# Patient Record
Sex: Female | Born: 1945 | Race: White | Hispanic: No | Smoking: Former smoker
Health system: Southern US, Community
[De-identification: ages and names within clinical notes are randomized; demographics above are authoritative.]

## PROBLEM LIST (undated history)

## (undated) DIAGNOSIS — Z87891 Personal history of nicotine dependence: Secondary | ICD-10-CM

## (undated) DIAGNOSIS — F32A Depression, unspecified: Secondary | ICD-10-CM

## (undated) DIAGNOSIS — F329 Major depressive disorder, single episode, unspecified: Secondary | ICD-10-CM

## (undated) DIAGNOSIS — E785 Hyperlipidemia, unspecified: Secondary | ICD-10-CM

## (undated) DIAGNOSIS — S42301A Unspecified fracture of shaft of humerus, right arm, initial encounter for closed fracture: Secondary | ICD-10-CM

## (undated) DIAGNOSIS — IMO0002 Reserved for concepts with insufficient information to code with codable children: Secondary | ICD-10-CM

## (undated) DIAGNOSIS — I700 Atherosclerosis of aorta: Secondary | ICD-10-CM

## (undated) DIAGNOSIS — Z905 Acquired absence of kidney: Secondary | ICD-10-CM

## (undated) DIAGNOSIS — Z524 Kidney donor: Secondary | ICD-10-CM

## (undated) DIAGNOSIS — A64 Unspecified sexually transmitted disease: Secondary | ICD-10-CM

## (undated) DIAGNOSIS — M1990 Unspecified osteoarthritis, unspecified site: Secondary | ICD-10-CM

## (undated) DIAGNOSIS — N3281 Overactive bladder: Secondary | ICD-10-CM

## (undated) DIAGNOSIS — Z131 Encounter for screening for diabetes mellitus: Secondary | ICD-10-CM

## (undated) DIAGNOSIS — M419 Scoliosis, unspecified: Secondary | ICD-10-CM

## (undated) DIAGNOSIS — S42302A Unspecified fracture of shaft of humerus, left arm, initial encounter for closed fracture: Secondary | ICD-10-CM

## (undated) DIAGNOSIS — E559 Vitamin D deficiency, unspecified: Secondary | ICD-10-CM

## (undated) HISTORY — DX: Unspecified sexually transmitted disease: A64

## (undated) HISTORY — DX: Reserved for concepts with insufficient information to code with codable children: IMO0002

## (undated) HISTORY — PX: ABDOMINAL HYSTERECTOMY: SHX81

## (undated) HISTORY — DX: Personal history of nicotine dependence: Z87.891

## (undated) HISTORY — DX: Overactive bladder: N32.81

## (undated) HISTORY — DX: Atherosclerosis of aorta: I70.0

## (undated) HISTORY — DX: Encounter for screening for diabetes mellitus: Z13.1

## (undated) HISTORY — DX: Depression, unspecified: F32.A

## (undated) HISTORY — DX: Kidney donor: Z52.4

## (undated) HISTORY — DX: Unspecified osteoarthritis, unspecified site: M19.90

## (undated) HISTORY — DX: Scoliosis, unspecified: M41.9

## (undated) HISTORY — DX: Unspecified fracture of shaft of humerus, right arm, initial encounter for closed fracture: S42.301A

## (undated) HISTORY — DX: Hyperlipidemia, unspecified: E78.5

## (undated) HISTORY — DX: Acquired absence of kidney: Z90.5

## (undated) HISTORY — PX: TONSILLECTOMY: SUR1361

## (undated) HISTORY — DX: Vitamin D deficiency, unspecified: E55.9

## (undated) HISTORY — DX: Unspecified fracture of shaft of humerus, left arm, initial encounter for closed fracture: S42.302A

---
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
## 1999-06-27 ENCOUNTER — Other Ambulatory Visit: Admission: RE | Admit: 1999-06-27 | Discharge: 1999-06-27 | Payer: Self-pay | Admitting: Cardiology

## 2000-06-08 ENCOUNTER — Encounter: Admission: RE | Admit: 2000-06-08 | Discharge: 2000-06-08 | Payer: Self-pay | Admitting: Internal Medicine

## 2000-06-08 ENCOUNTER — Encounter: Payer: Self-pay | Admitting: Internal Medicine

## 2001-04-16 ENCOUNTER — Other Ambulatory Visit: Admission: RE | Admit: 2001-04-16 | Discharge: 2001-04-16 | Payer: Self-pay | Admitting: Obstetrics and Gynecology

## 2001-04-28 DIAGNOSIS — Z524 Kidney donor: Secondary | ICD-10-CM

## 2001-04-28 HISTORY — DX: Kidney donor: Z52.4

## 2001-07-08 ENCOUNTER — Encounter: Admission: RE | Admit: 2001-07-08 | Discharge: 2001-07-08 | Payer: Self-pay | Admitting: Obstetrics and Gynecology

## 2001-07-08 ENCOUNTER — Encounter: Payer: Self-pay | Admitting: Obstetrics and Gynecology

## 2002-08-26 ENCOUNTER — Encounter: Payer: Self-pay | Admitting: Obstetrics and Gynecology

## 2002-08-26 ENCOUNTER — Encounter: Admission: RE | Admit: 2002-08-26 | Discharge: 2002-08-26 | Payer: Self-pay | Admitting: Obstetrics and Gynecology

## 2003-04-21 ENCOUNTER — Other Ambulatory Visit: Admission: RE | Admit: 2003-04-21 | Discharge: 2003-04-21 | Payer: Self-pay | Admitting: Obstetrics and Gynecology

## 2003-08-31 ENCOUNTER — Encounter: Admission: RE | Admit: 2003-08-31 | Discharge: 2003-08-31 | Payer: Self-pay | Admitting: Obstetrics and Gynecology

## 2004-06-20 ENCOUNTER — Other Ambulatory Visit: Admission: RE | Admit: 2004-06-20 | Discharge: 2004-06-20 | Payer: Self-pay | Admitting: Obstetrics and Gynecology

## 2004-08-28 DIAGNOSIS — S42301A Unspecified fracture of shaft of humerus, right arm, initial encounter for closed fracture: Secondary | ICD-10-CM

## 2004-08-28 HISTORY — DX: Unspecified fracture of shaft of humerus, right arm, initial encounter for closed fracture: S42.301A

## 2004-11-21 ENCOUNTER — Encounter: Admission: RE | Admit: 2004-11-21 | Discharge: 2004-11-21 | Payer: Self-pay | Admitting: Obstetrics and Gynecology

## 2004-11-25 ENCOUNTER — Encounter: Admission: RE | Admit: 2004-11-25 | Discharge: 2004-11-25 | Payer: Self-pay | Admitting: Obstetrics and Gynecology

## 2005-02-01 ENCOUNTER — Emergency Department (HOSPITAL_COMMUNITY): Admission: EM | Admit: 2005-02-01 | Discharge: 2005-02-02 | Payer: Self-pay | Admitting: Emergency Medicine

## 2005-06-28 ENCOUNTER — Other Ambulatory Visit: Admission: RE | Admit: 2005-06-28 | Discharge: 2005-06-28 | Payer: Self-pay | Admitting: Obstetrics and Gynecology

## 2005-07-28 HISTORY — PX: BUNIONECTOMY WITH HAMMERTOE RECONSTRUCTION: SHX5600

## 2005-12-05 ENCOUNTER — Encounter: Admission: RE | Admit: 2005-12-05 | Discharge: 2005-12-05 | Payer: Self-pay | Admitting: Obstetrics and Gynecology

## 2006-07-24 ENCOUNTER — Other Ambulatory Visit: Admission: RE | Admit: 2006-07-24 | Discharge: 2006-07-24 | Payer: Self-pay | Admitting: Obstetrics and Gynecology

## 2006-12-27 ENCOUNTER — Encounter: Admission: RE | Admit: 2006-12-27 | Discharge: 2006-12-27 | Payer: Self-pay | Admitting: Obstetrics and Gynecology

## 2007-09-18 ENCOUNTER — Other Ambulatory Visit: Admission: RE | Admit: 2007-09-18 | Discharge: 2007-09-18 | Payer: Self-pay | Admitting: Obstetrics and Gynecology

## 2007-12-30 ENCOUNTER — Encounter: Admission: RE | Admit: 2007-12-30 | Discharge: 2007-12-30 | Payer: Self-pay | Admitting: Obstetrics and Gynecology

## 2008-12-03 ENCOUNTER — Encounter: Admission: RE | Admit: 2008-12-03 | Discharge: 2008-12-24 | Payer: Self-pay | Admitting: Family Medicine

## 2008-12-30 ENCOUNTER — Encounter: Admission: RE | Admit: 2008-12-30 | Discharge: 2008-12-30 | Payer: Self-pay | Admitting: Obstetrics and Gynecology

## 2010-01-06 ENCOUNTER — Encounter: Admission: RE | Admit: 2010-01-06 | Discharge: 2010-01-06 | Payer: Self-pay | Admitting: Obstetrics and Gynecology

## 2010-03-10 ENCOUNTER — Encounter: Admission: RE | Admit: 2010-03-10 | Discharge: 2010-03-10 | Payer: Self-pay | Admitting: Obstetrics and Gynecology

## 2010-12-28 ENCOUNTER — Other Ambulatory Visit: Payer: Self-pay | Admitting: Obstetrics and Gynecology

## 2010-12-28 DIAGNOSIS — Z1231 Encounter for screening mammogram for malignant neoplasm of breast: Secondary | ICD-10-CM

## 2011-01-09 ENCOUNTER — Ambulatory Visit
Admission: RE | Admit: 2011-01-09 | Discharge: 2011-01-09 | Disposition: A | Discharge status: Home / Self Care | Payer: Medicare Other | Source: Ambulatory Visit | Attending: Obstetrics and Gynecology | Admitting: Obstetrics and Gynecology

## 2011-01-09 DIAGNOSIS — Z1231 Encounter for screening mammogram for malignant neoplasm of breast: Secondary | ICD-10-CM

## 2012-01-05 ENCOUNTER — Other Ambulatory Visit: Payer: Self-pay | Admitting: Obstetrics and Gynecology

## 2012-01-05 DIAGNOSIS — Z1231 Encounter for screening mammogram for malignant neoplasm of breast: Secondary | ICD-10-CM

## 2012-01-18 ENCOUNTER — Ambulatory Visit
Admission: RE | Admit: 2012-01-18 | Discharge: 2012-01-18 | Disposition: A | Discharge status: Home / Self Care | Payer: Medicare Other | Source: Ambulatory Visit | Attending: Obstetrics and Gynecology | Admitting: Obstetrics and Gynecology

## 2012-01-18 DIAGNOSIS — Z1231 Encounter for screening mammogram for malignant neoplasm of breast: Secondary | ICD-10-CM

## 2013-01-01 ENCOUNTER — Other Ambulatory Visit: Payer: Self-pay

## 2013-01-01 DIAGNOSIS — Z1231 Encounter for screening mammogram for malignant neoplasm of breast: Secondary | ICD-10-CM

## 2013-01-14 ENCOUNTER — Telehealth: Payer: Self-pay | Admitting: Internal Medicine

## 2013-01-14 NOTE — Telephone Encounter (Signed)
Patient called to say she has transferred her care to [facility].

## 2013-01-26 DIAGNOSIS — M419 Scoliosis, unspecified: Secondary | ICD-10-CM

## 2013-01-26 DIAGNOSIS — IMO0002 Reserved for concepts with insufficient information to code with codable children: Secondary | ICD-10-CM

## 2013-01-26 HISTORY — DX: Reserved for concepts with insufficient information to code with codable children: IMO0002

## 2013-01-26 HISTORY — DX: Scoliosis, unspecified: M41.9

## 2013-02-05 ENCOUNTER — Ambulatory Visit: Payer: Medicare Other

## 2013-02-07 ENCOUNTER — Ambulatory Visit: Payer: Medicare Other

## 2013-02-10 ENCOUNTER — Ambulatory Visit
Admission: RE | Admit: 2013-02-10 | Discharge: 2013-02-10 | Disposition: A | Discharge status: Home / Self Care | Payer: Medicare Other | Source: Ambulatory Visit

## 2013-02-10 DIAGNOSIS — Z1231 Encounter for screening mammogram for malignant neoplasm of breast: Secondary | ICD-10-CM

## 2013-03-18 ENCOUNTER — Encounter: Payer: Self-pay | Admitting: Obstetrics and Gynecology

## 2013-03-19 ENCOUNTER — Encounter: Payer: Self-pay | Admitting: Obstetrics and Gynecology

## 2013-03-19 ENCOUNTER — Ambulatory Visit (INDEPENDENT_AMBULATORY_CARE_PROVIDER_SITE_OTHER): Payer: Medicare Other | Admitting: Obstetrics and Gynecology

## 2013-03-19 VITALS — BP 104/60 | HR 64 | Resp 16 | Ht 62.5 in | Wt 136.0 lb

## 2013-03-19 DIAGNOSIS — Z01419 Encounter for gynecological examination (general) (routine) without abnormal findings: Secondary | ICD-10-CM

## 2013-03-19 MED ORDER — VITAMIN D (ERGOCALCIFEROL) 1.25 MG (50000 UNIT) PO CAPS: 50000.0000 [IU] | ORAL_CAPSULE | ORAL | Status: DC

## 2013-03-19 MED ORDER — TOLTERODINE TARTRATE ER 4 MG PO CP24: 4.0000 mg | ORAL_CAPSULE | Freq: Every day | ORAL | Status: DC

## 2013-03-19 MED ORDER — ESTROGENS CONJUGATED 0.45 MG PO TABS: 0.4500 mg | ORAL_TABLET | Freq: Every day | ORAL | Status: DC

## 2013-03-19 MED ORDER — VALACYCLOVIR HCL 500 MG PO TABS: ORAL_TABLET | ORAL | Status: DC

## 2013-03-19 NOTE — Patient Instructions (Signed)

## 2013-03-19 NOTE — Progress Notes (Signed)
67 y.o.   Divorced    Caucasian   female   G0P0.   here for annual exam.  Rotator cuff and back issues this year.    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  Likes her premarin and her detrol and wants to cont.  No LMP recorded. Patient is postmenopausal.          Sexually active: no  The current method of family planning is none.    Exercising: walking 3-4 days week, back exercises daily Last mammogram: 12/2012 normal  Last pap smear:09/18/07 neg History of abnormal pap: 50 years ago Smoking: quit smoking 20 years ago Alcohol: 6-7 glasses of wine a week Last colonoscopy: 2004 normal, appt 03/24/13 at [facility] Last Bone Density:  02/2010 normal Last tetanus shot: March 06, 2011 Last cholesterol check: not sure  Hgb: pcp               Urine: pcp   Family History  Problem Relation Age of Onset  . Diabetes Mother   . Hypertension Mother   . Heart disease Father     There are no active problems to display for this patient.   Past Medical History  Diagnosis Date  . 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 Broken arms 2006    broke both arms in a fall    Past Surgical History  Procedure Laterality Date  . Bunionectomy with hammertoe reconstruction  07/2005  . Abdominal hysterectomy    . Tonsillectomy      Allergies: Shellfish allergy  Current Outpatient Prescriptions  Medication Sig Dispense Refill  . BIOTIN PO Take by mouth daily.      . Coenzyme Q10-Levocarnitine (CO Q-10 PLUS PO) Take by mouth daily.      Marland Kitchen estrogens, conjugated, (PREMARIN) 0.45 MG tablet Take 0.45 mg by mouth daily. Take daily for 21 days then do not take for 7 days.      . Omega-3 Fatty Acids (OMEGA 3 PO) Take by mouth daily.      Marland Kitchen tolterodine (DETROL LA) 4 MG 24 hr capsule Take 4 mg by mouth daily.      . valACYclovir (VALTREX) 1000 MG tablet Take 1,000 mg by mouth daily. Pt takes 1/2 tab daily      . Vitamin D, Ergocalciferol, (DRISDOL) 50000 UNITS CAPS Take  50,000 Units by mouth every 14 (fourteen) days.       No current facility-administered medications for this visit.    ROS: Pertinent items are noted in HPI.  Social Hx:  Divorced, retired from Agricultural consultant, has a house at [place] she loves going to  Exam:    BP 104/60  Pulse 64  Resp 16  Ht 5' 2.5" (1.588 m)  Wt 136 lb (61.689 kg)  BMI 24.46 kg/m2  Ht and wt stable from last year Wt Readings from Last 3 Encounters:  03/19/13 136 lb (61.689 kg)     Ht Readings from Last 3 Encounters:  03/19/13 5' 2.5" (1.588 m)    General appearance: alert, cooperative and appears stated age Head: Normocephalic, without obvious abnormality, atraumatic Neck: no adenopathy, supple, symmetrical, trachea midline and thyroid not enlarged, symmetric, no tenderness/mass/nodules Lungs: clear to auscultation bilaterally Breasts: Inspection negative, No nipple retraction or dimpling, No nipple discharge or bleeding, No axillary or supraclavicular adenopathy, Normal to palpation without dominant masses Heart: regular rate and rhythm Abdomen: soft, non-tender; bowel sounds normal; no masses,  no organomegaly Extremities: extremities normal, atraumatic, no cyanosis or edema Skin: Skin color, texture, turgor normal. No rashes or lesions Lymph nodes: Cervical, supraclavicular, and axillary nodes normal. No abnormal inguinal nodes palpated Neurologic: Grossly normal   Pelvic: External genitalia:  no lesions              Urethra:  normal appearing urethra with no masses, tenderness or lesions              Bartholins and Skenes: normal                 Vagina: normal appearing vagina with normal color and discharge, no lesions              Cervix: absent              Pap taken: no        Bimanual Exam:  Uterus:  absent                                      Adnexa: normal adnexa in size, nontender and no masses                                      Rectovaginal: Confirms                                       Anus:  normal sphincter tone, no lesions  A: normal menopausal exam, on HRT     TAH 1976 for dysplasia, ovaries retained     HSV right buttock     Kidney donor in 2002     P:     mammogram counseled on breast self exam, mammography screening, adequate intake of calcium and vitamin D, diet and exercise return annually or prn     An After Visit Summary was printed and given to the patient.

## 2013-07-31 ENCOUNTER — Other Ambulatory Visit: Payer: Self-pay | Admitting: Family Medicine

## 2013-07-31 DIAGNOSIS — IMO0002 Reserved for concepts with insufficient information to code with codable children: Secondary | ICD-10-CM

## 2013-07-31 DIAGNOSIS — M79605 Pain in left leg: Secondary | ICD-10-CM

## 2013-08-05 ENCOUNTER — Ambulatory Visit
Admission: RE | Admit: 2013-08-05 | Discharge: 2013-08-05 | Disposition: A | Discharge status: Home / Self Care | Payer: Medicare Other | Source: Ambulatory Visit | Attending: Family Medicine | Admitting: Family Medicine

## 2013-08-05 DIAGNOSIS — IMO0002 Reserved for concepts with insufficient information to code with codable children: Secondary | ICD-10-CM

## 2013-08-05 DIAGNOSIS — M79605 Pain in left leg: Secondary | ICD-10-CM

## 2013-10-24 ENCOUNTER — Telehealth: Payer: Self-pay | Admitting: Obstetrics and Gynecology

## 2013-10-24 NOTE — Telephone Encounter (Signed)
Patient calling, states she has been on Premarin "for ages" and taking daily. Noted today that her rx states one tablet daily as well as take for 21 days and then stop taking for 7 days. Advised that she can take Premarin daily per her records and I would confirm with Dr. Edward JollySilva and call back with any changes in rx.  Patient is agreeable.

## 2013-10-24 NOTE — Telephone Encounter (Signed)
Pt has some questions regarding premarin

## 2013-10-26 ENCOUNTER — Other Ambulatory Visit: Payer: Self-pay | Admitting: Obstetrics and Gynecology

## 2013-10-26 MED ORDER — ESTROGENS CONJUGATED 0.45 MG PO TABS: 0.4500 mg | ORAL_TABLET | Freq: Every day | ORAL | Status: DC

## 2013-10-26 NOTE — Telephone Encounter (Signed)
Her Premarin 0.45 mg should be taken daily. I will make this correction in her prescription in Epic.   Thank you!

## 2013-12-10 ENCOUNTER — Encounter: Payer: Self-pay | Admitting: Obstetrics and Gynecology

## 2014-01-20 ENCOUNTER — Other Ambulatory Visit: Payer: Self-pay

## 2014-01-20 DIAGNOSIS — Z1231 Encounter for screening mammogram for malignant neoplasm of breast: Secondary | ICD-10-CM

## 2014-02-13 ENCOUNTER — Ambulatory Visit
Admission: RE | Admit: 2014-02-13 | Discharge: 2014-02-13 | Disposition: A | Discharge status: Home / Self Care | Payer: 59 | Source: Ambulatory Visit

## 2014-02-13 DIAGNOSIS — Z1231 Encounter for screening mammogram for malignant neoplasm of breast: Secondary | ICD-10-CM

## 2014-03-20 ENCOUNTER — Ambulatory Visit: Payer: Medicare Other | Admitting: Obstetrics and Gynecology

## 2014-03-30 ENCOUNTER — Ambulatory Visit: Payer: Medicare Other | Admitting: Obstetrics and Gynecology

## 2014-04-02 ENCOUNTER — Encounter: Payer: Self-pay | Admitting: Obstetrics and Gynecology

## 2014-04-02 ENCOUNTER — Ambulatory Visit (INDEPENDENT_AMBULATORY_CARE_PROVIDER_SITE_OTHER): Payer: Medicare Other | Admitting: Obstetrics and Gynecology

## 2014-04-02 VITALS — BP 114/60 | HR 70 | Resp 18 | Ht 62.5 in | Wt 136.6 lb

## 2014-04-02 DIAGNOSIS — Z01419 Encounter for gynecological examination (general) (routine) without abnormal findings: Secondary | ICD-10-CM

## 2014-04-02 DIAGNOSIS — E559 Vitamin D deficiency, unspecified: Secondary | ICD-10-CM

## 2014-04-02 MED ORDER — VALACYCLOVIR HCL 500 MG PO TABS: ORAL_TABLET | ORAL | Status: DC

## 2014-04-02 MED ORDER — TOLTERODINE TARTRATE ER 4 MG PO CP24: 4.0000 mg | ORAL_CAPSULE | Freq: Every day | ORAL | Status: DC

## 2014-04-02 MED ORDER — ESTROGENS CONJUGATED 0.3 MG PO TABS: 0.3000 mg | ORAL_TABLET | Freq: Every day | ORAL | Status: DC

## 2014-04-02 NOTE — Patient Instructions (Signed)

## 2014-04-02 NOTE — Progress Notes (Signed)
Patient ID: Kristen Adams, female   DOB: 08-31-1945, 68 y.o.   MRN: [id number] GYNECOLOGY VISIT  PCP:  Sigmund HazelLisa Miller, MD  Referring provider:   HPI: 68 y.o.   Divorced  Caucasian  female   G0P0 with Patient's last menstrual period was 08/28/1974.   here for   AEX.  On Premarin 0.45 mg daily.  Was started on this for hot flashes.  Would like to decrease the dosage.   Wants a pap smear.  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  Has started Yoga.  Loves it!  No vitamin D in 2 weeks.  Was on weekly prior to this.   Hgb:    PCP Urine:  PCP  GYNECOLOGIC HISTORY: Patient's last menstrual period was 08/28/1974. Sexually active:  no   Partner preference: female Contraception:   Hysterectomy Menopausal hormone therapy:  Premarin 0.45mg  DES exposure:  no  Blood transfusions:  no  Sexually transmitted diseases:  HSV of buttocks  GYN procedures and prior surgeries:  TAH Last mammogram: 02-13-14 dense breasts, otherwise normal:[facility]               Last pap and high risk HPV testing:   09-18-07 normal History of abnormal pap smear:  50 years ago had dysplasia of cervix and patient states this is reason for Hysterectomy.   OB History   Grav Para Term Preterm Abortions TAB SAB Ect Mult Living   0                LIFESTYLE: Exercise:  Yoga and walking             Tobacco:   no Alcohol:    6 glasses of wine per week Drug use:  no  OTHER HEALTH MAINTENANCE: Tetanus/TDap:   03-06-11 Gardisil:            n/a Influenza:         05/2013 Zostavax:          Completed with PCP  Bone density:   02/2010 normal:[facility]  Colonoscopy:   05/2013 normal with Dr. Dulce Sellarutlaw at [facility].  Probably will not need another colonoscopy due to her age.  Cholesterol check: 2013 normal with PCP  Family History  Problem Relation Age of Onset  . Diabetes Mother   . Hypertension Mother   . Heart disease Father     There are no active problems to display for this  patient.  Past Medical History  Diagnosis Date  . 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 Broken arms 2006    broke both arms in a fall  . Scoliosis 01/2013  . DDD (degenerative disc disease) 01/2013    lower spine  . Arthritis     Past Surgical History  Procedure Laterality Date  . Bunionectomy with hammertoe reconstruction  07/2005  . Abdominal hysterectomy    . Tonsillectomy      ALLERGIES: Shellfish allergy  Current Outpatient Prescriptions  Medication Sig Dispense Refill  . BIOTIN PO Take by mouth daily.      . Coenzyme Q10-Levocarnitine (CO Q-10 PLUS PO) Take by mouth daily.      Marland Kitchen. estrogens, conjugated, (PREMARIN) 0.45 MG tablet Take 1 tablet (0.45 mg total) by mouth daily.  90 tablet  1  . Omega-3 Fatty Acids (OMEGA 3 PO) Take by mouth daily.      Marland Kitchen. tolterodine (DETROL LA) 4 MG 24  hr capsule Take 1 capsule (4 mg total) by mouth daily.  90 capsule  3  . valACYclovir (VALTREX) 500 MG tablet Take one daily  90 tablet  3  . Vitamin D, Ergocalciferol, (DRISDOL) 50000 UNITS CAPS Take 1 capsule (50,000 Units total) by mouth every 14 (fourteen) days.  30 capsule  0   No current facility-administered medications for this visit.     ROS:  Pertinent items are noted in HPI.  SOCIAL HISTORY:  Retired.  PHYSICAL EXAMINATION:    BP 114/60  Pulse 70  Resp 18  Ht 5' 2.5" (1.588 m)  Wt 136 lb 9.6 oz (61.961 kg)  BMI 24.57 kg/m2  LMP 08/28/1974   Wt Readings from Last 3 Encounters:  04/02/14 136 lb 9.6 oz (61.961 kg)  03/19/13 136 lb (61.689 kg)     Ht Readings from Last 3 Encounters:  04/02/14 5' 2.5" (1.588 m)  03/19/13 5' 2.5" (1.588 m)    General appearance: alert, cooperative and appears stated age Head: Normocephalic, without obvious abnormality, atraumatic Neck: no adenopathy, supple, symmetrical, trachea midline and thyroid not enlarged, symmetric, no tenderness/mass/nodules Lungs: clear to auscultation  bilaterally Breasts: Inspection negative, No nipple retraction or dimpling, No nipple discharge or bleeding, No axillary or supraclavicular adenopathy, Normal to palpation without dominant masses Heart: regular rate and rhythm Abdomen: soft, non-tender; no masses,  no organomegaly Extremities: extremities normal, atraumatic, no cyanosis or edema Skin: Skin color, texture, turgor normal. No rashes or lesions Lymph nodes: Cervical, supraclavicular, and axillary nodes normal. No abnormal inguinal nodes palpated Neurologic: Grossly normal  Pelvic: External genitalia:  no lesions              Urethra:  normal appearing urethra with no masses, tenderness or lesions              Bartholins and Skenes: normal                 Vagina: normal appearing vagina with normal color and discharge, no lesions              Cervix: absent              Pap and high risk: Yes.  .            Bimanual Exam:  Uterus:  absent                                      Adnexa: normal adnexa in size, nontender and no masses                                      Rectovaginal: Confirms                                      Anus:  normal sphincter tone, no lesions  ASSESSMENT  Normal gynecologic exam. Status post hysterectomy for cervical dysplasia. ERT patient.  History of HSV. Vitamin D deficency.   PLAN  Mammogram recommended yearly.  Pap smear performed.  Counseled on self breast exam, Calcium and vitamin D intake, exercise. Check Vit D level. Reduce Premarin to 0.3 mg daily.  Discussed risks of stroke, MI, DVT, PE, ans breast cancer.  Wishes to continue with therapy.  See Epic  orders. Valtrex 500 mg daily.  See Epic orders. Return annually or prn   An After Visit Summary was printed and given to the patient.

## 2014-04-03 LAB — VITAMIN D 25 HYDROXY (VIT D DEFICIENCY, FRACTURES): VIT D 25 HYDROXY: 44 ng/mL (ref 30–89)

## 2014-04-07 LAB — IPS PAP SMEAR ONLY

## 2014-04-24 ENCOUNTER — Encounter: Payer: Self-pay | Admitting: Obstetrics and Gynecology

## 2014-10-14 ENCOUNTER — Telehealth: Payer: Self-pay | Admitting: Obstetrics and Gynecology

## 2014-10-14 NOTE — Telephone Encounter (Signed)
Medication refill request: ? Last AEX:  04/02/14 Dr. Edward JollySilva Next AEX: 04/30/15 Last MMG (if hormonal medication request): 02/16/14 BIRADS1:Neg Refill authorized: Premarin 04/02/14 #90/3R.

## 2014-10-14 NOTE — Telephone Encounter (Signed)
Patient's states her insurance does not cover premarin anymore. She wants to know if she can have an alternative

## 2014-10-14 NOTE — Telephone Encounter (Signed)
I believe that the patient may need a prior authorization for her Premarin.  Please assist.  Cc- Kristen Adams

## 2014-10-15 NOTE — Telephone Encounter (Signed)
I would have the patient do Estradiol 0.5 mg daily.  She may have refills until she is due for her annual exam. Stop Premarin.

## 2014-10-15 NOTE — Telephone Encounter (Signed)
Patient's prior authorization for premarin has been denied. Forms to Dr.Silva's desk for review and advise.

## 2014-10-15 NOTE — Telephone Encounter (Signed)
It looks like Femring is a covered medication if the patient would like to try an estrogen that is covered.  Other options are to pay out of pocket for the Premarin or switch to [facility]stradiol which I [facility]?

## 2014-10-15 NOTE — Telephone Encounter (Signed)
Spoke with [facility]. Initiated prior authorization for Premarin 0.3mg  tablets by phone. Will await results.

## 2014-10-15 NOTE — Telephone Encounter (Signed)
Spoke with patient. Advised patient of message as seen below from Dr.Silva. Patient would like to start on Estradiol at this time. Patient would like rx sent to [facility] off [address] Dr.Silva please advised dose and I will be happy to place order.

## 2014-10-16 MED ORDER — ESTRADIOL 0.5 MG PO TABS: 0.5000 mg | ORAL_TABLET | Freq: Every day | ORAL | Status: DC

## 2014-10-16 NOTE — Telephone Encounter (Signed)
Estradiol 0.5mg  #90 2RF sent to [facility] off [address]. Patient aware Estradiol will replace premarin and needs to stop taking premarin once she picks up estradiol.  Routing to provider for final review. Patient agreeable to disposition. Will close encounter

## 2015-01-21 ENCOUNTER — Other Ambulatory Visit: Payer: Self-pay

## 2015-01-21 DIAGNOSIS — Z1231 Encounter for screening mammogram for malignant neoplasm of breast: Secondary | ICD-10-CM

## 2015-02-18 ENCOUNTER — Ambulatory Visit
Admission: RE | Admit: 2015-02-18 | Discharge: 2015-02-18 | Disposition: A | Discharge status: Home / Self Care | Payer: Medicare Other | Source: Ambulatory Visit

## 2015-02-18 DIAGNOSIS — Z1231 Encounter for screening mammogram for malignant neoplasm of breast: Secondary | ICD-10-CM

## 2015-03-09 ENCOUNTER — Other Ambulatory Visit: Payer: Self-pay | Admitting: Obstetrics and Gynecology

## 2015-03-09 NOTE — Telephone Encounter (Signed)
Medication refill request: Valtrex 500 mg  Last AEX:  04/02/14 with BS Next AEX: 04/30/15 with BS Last MMG (if hormonal medication request): n/a Refill authorized: #90/0 rfs  (Routed to Dr. Hyacinth MeekerMiller since Dr. Edward JollySilva is out of office today)

## 2015-03-25 ENCOUNTER — Other Ambulatory Visit: Payer: Self-pay | Admitting: Obstetrics and Gynecology

## 2015-03-25 NOTE — Telephone Encounter (Signed)
Medication refill request: Detrol  Last AEX:  04/02/14 Dr. Edward Jolly Next AEX: 04/30/15 Dr. Edward Jolly Last MMG (if hormonal medication request): 02/18/15 BIRADS1:Neg Refill authorized: 04/02/14 #90caps /3 R. Today #90/0R?

## 2015-03-29 ENCOUNTER — Other Ambulatory Visit: Payer: Self-pay | Admitting: Obstetrics and Gynecology

## 2015-03-29 NOTE — Telephone Encounter (Signed)
03/26/15 #90/0 rfs sent to [facility]- rx denied.

## 2015-04-30 ENCOUNTER — Encounter: Payer: Self-pay | Admitting: Obstetrics and Gynecology

## 2015-04-30 ENCOUNTER — Ambulatory Visit (INDEPENDENT_AMBULATORY_CARE_PROVIDER_SITE_OTHER): Payer: Medicare Other | Admitting: Obstetrics and Gynecology

## 2015-04-30 ENCOUNTER — Ambulatory Visit: Payer: Medicare Other | Admitting: Obstetrics and Gynecology

## 2015-04-30 VITALS — BP 108/60 | HR 72 | Resp 16 | Ht 62.5 in | Wt 136.0 lb

## 2015-04-30 DIAGNOSIS — Z01419 Encounter for gynecological examination (general) (routine) without abnormal findings: Secondary | ICD-10-CM | POA: Diagnosis not present

## 2015-04-30 DIAGNOSIS — M8580 Other specified disorders of bone density and structure, unspecified site: Secondary | ICD-10-CM

## 2015-04-30 DIAGNOSIS — Z0000 Encounter for general adult medical examination without abnormal findings: Secondary | ICD-10-CM

## 2015-04-30 LAB — POCT URINALYSIS DIPSTICK
BILIRUBIN UA: NEGATIVE
Blood, UA: NEGATIVE
Glucose, UA: NEGATIVE
Ketones, UA: NEGATIVE
LEUKOCYTES UA: NEGATIVE
Nitrite, UA: NEGATIVE
PROTEIN UA: NEGATIVE
Urobilinogen, UA: NEGATIVE
pH, UA: 6

## 2015-04-30 MED ORDER — VALACYCLOVIR HCL 500 MG PO TABS: 500.0000 mg | ORAL_TABLET | Freq: Every day | ORAL | Status: DC

## 2015-04-30 MED ORDER — ESTRADIOL 0.5 MG PO TABS: 0.5000 mg | ORAL_TABLET | Freq: Every day | ORAL | Status: DC

## 2015-04-30 MED ORDER — TOLTERODINE TARTRATE ER 4 MG PO CP24: 4.0000 mg | ORAL_CAPSULE | Freq: Every day | ORAL | Status: DC

## 2015-04-30 NOTE — Progress Notes (Signed)
69 y.o. G0P0 Divorced Caucasian female here for annual exam.    On ERT.  Taking Estrace 0.5 mg daily and wants to continue.   Noting some emotional changes.  Feeling down and on the verge of tears. Thinks this correlated with the switch from Premarin to Estrace.  Change was made per patient request to to cost reasons.  Hx of anxiety and depression in past and used Lexapro with important life events.  No current stressors.  Denies suicidal and homicidal ideation.   Detrol LA working well to control bladder symptoms.   PCP:  Sigmund Hazel   Patient's last menstrual period was 08/28/1974.          Sexually active: No.  The current method of family planning is status post hysterectomy.    Exercising: Yes.    Yoga, walking, gym Smoker:  no  Health Maintenance: Pap:  04/02/14 Neg History of abnormal Pap:  Yes, dysplasia ~ 50 years ago.  MMG:  02/18/15 BIRADS1:Neg Colonoscopy:  05/2013 Dr. Dulce Sellar - Normal  BMD:   02/2010   Result:  Osteopenia - mild. TDaP:  2012 Screening Labs:  Hb today: PCP, Urine today: Negative   reports that she quit smoking about 22 years ago. Her smoking use included Cigarettes. She has never used smokeless tobacco. She reports that she drinks about 3.5 oz of alcohol per week. She reports that she does not use illicit drugs.  Past Medical History  Diagnosis Date  . 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 Broken arms 2006    broke both arms in a fall  . Scoliosis 01/2013  . DDD (degenerative disc disease) 01/2013    lower spine  . Arthritis     Past Surgical History  Procedure Laterality Date  . Bunionectomy with hammertoe reconstruction  07/2005  . Abdominal hysterectomy    . Tonsillectomy      Current Outpatient Prescriptions  Medication Sig Dispense Refill  . BIOTIN PO Take by mouth daily.    . Cholecalciferol (VITAMIN D) 2000 UNITS tablet Take 2,000 Units by mouth daily.    . Coenzyme Q10-Levocarnitine (CO  Q-10 PLUS PO) Take by mouth daily.    Marland Kitchen estradiol (ESTRACE) 0.5 MG tablet Take 1 tablet (0.5 mg total) by mouth daily. 90 tablet 2  . Multiple Minerals-Vitamins (CALCIUM-MAGNESIUM-ZINC-D3 PO) Take by mouth daily.    . Omega-3 Fatty Acids (OMEGA 3 PO) Take by mouth daily.    Marland Kitchen tolterodine (DETROL LA) 4 MG 24 hr capsule TAKE 1 CAPSULE BY MOUTH DAILY. 90 capsule 0  . valACYclovir (VALTREX) 500 MG tablet TAKE 1 TABLET BY MOUTH DAILY 90 tablet 0   No current facility-administered medications for this visit.    Family History  Problem Relation Age of Onset  . Diabetes Mother   . Hypertension Mother   . Heart disease Father     ROS:  Pertinent items are noted in HPI.  Otherwise, a comprehensive ROS was negative.  Exam:   BP 108/60 mmHg  Pulse 72  Resp 16  Ht 5' 2.5" (1.588 m)  Wt 136 lb (61.689 kg)  BMI 24.46 kg/m2  LMP 08/28/1974    General appearance: alert, cooperative and appears stated age Head: Normocephalic, without obvious abnormality, atraumatic Neck: no adenopathy, supple, symmetrical, trachea midline and thyroid normal to inspection and palpation Lungs: clear to auscultation bilaterally Breasts: normal appearance, no masses or tenderness, Inspection negative, No nipple retraction or dimpling,  No nipple discharge or bleeding, No axillary or supraclavicular adenopathy Heart: regular rate and rhythm Abdomen: soft, non-tender; bowel sounds normal; no masses,  no organomegaly Extremities: extremities normal, atraumatic, no cyanosis or edema Skin: Skin color, texture, turgor normal. No rashes or lesions Lymph nodes: Cervical, supraclavicular, and axillary nodes normal. No abnormal inguinal nodes palpated Neurologic: Grossly normal  Pelvic: External genitalia:  no lesions              Urethra:  normal appearing urethra with no masses, tenderness or lesions              Bartholins and Skenes: normal                 Vagina: normal appearing vagina with normal color and  discharge, erythema right apex - Pap taken.              Cervix: absent              Pap taken: Yes.   Bimanual Exam:  Uterus:  uterus absent              Adnexa: no mass, fullness, tenderness              Rectovaginal: Yes.  .  Confirms.              Anus:  normal sphincter tone, no lesions  Chaperone was present for exam.  Assessment:   Well woman visit with normal exam. Status post hysterectomy for cervical dysplasia. ERT patient.  Depression.   History of HSV. Overactive bladder.  On Detrol LA. Vitamin D deficency.   Plan: Yearly mammogram recommended after age 35.  Recommended self breast exam.  Pap and HR HPV as above. Discussed Calcium, Vitamin D, regular exercise program including cardiovascular and weight bearing exercise. Labs performed.  No..   See orders. Refills given on medications.  Yes.  .  See orders.  Estrace 0.5 mg.  Patient will try to take 1/2 tablet.  Discussed risks of DVT, PE, MI, stroke, breast cancer.  Continue Detrol LA 4 mg daily.     Continue Valtrex 500 mg daily. Bone density ordered.  Will do with next mammogram.  Discussed medical therapy for depression, counseling.  Patient is declining both at this time.  She will continue to monitor and call back if she decides to pursue either or both of these.  Follow up annually and prn.      After visit summary provided.

## 2015-04-30 NOTE — Patient Instructions (Signed)

## 2015-05-06 LAB — IPS PAP SMEAR ONLY

## 2015-05-13 ENCOUNTER — Telehealth: Payer: Self-pay | Admitting: Obstetrics and Gynecology

## 2015-05-13 NOTE — Telephone Encounter (Addendum)
Spoke with patient. Advised of results as seen below from Dr.Silva. Patient is agreeable and verbalizes understanding.  Notes Recorded by Alphonsa Overall, CMA on 05/06/2015 at 1:55 PM AEX scheduled 05-17-16. Notes Recorded by Patton Salles, MD on 05/06/2015 at 12:26 PM Pap normal.  Annual exam recall - 02.  Routing to provider for final review. Patient agreeable to disposition. Will close encounter.

## 2015-05-13 NOTE — Telephone Encounter (Signed)
Patient calling to get papsmear results did not receive letter in the mail.

## 2015-05-28 ENCOUNTER — Emergency Department (HOSPITAL_COMMUNITY)
Admission: EM | Admit: 2015-05-28 | Discharge: 2015-05-28 | Disposition: A | Discharge status: Home / Self Care | Payer: Medicare Other | Attending: Emergency Medicine | Admitting: Emergency Medicine

## 2015-05-28 ENCOUNTER — Encounter (HOSPITAL_COMMUNITY): Payer: Self-pay | Admitting: Emergency Medicine

## 2015-05-28 DIAGNOSIS — Y998 Other external cause status: Secondary | ICD-10-CM | POA: Insufficient documentation

## 2015-05-28 DIAGNOSIS — Y280XXA Contact with sharp glass, undetermined intent, initial encounter: Secondary | ICD-10-CM | POA: Insufficient documentation

## 2015-05-28 DIAGNOSIS — Z8739 Personal history of other diseases of the musculoskeletal system and connective tissue: Secondary | ICD-10-CM | POA: Insufficient documentation

## 2015-05-28 DIAGNOSIS — Z79899 Other long term (current) drug therapy: Secondary | ICD-10-CM | POA: Diagnosis not present

## 2015-05-28 DIAGNOSIS — S91119A Laceration without foreign body of unspecified toe without damage to nail, initial encounter: Secondary | ICD-10-CM

## 2015-05-28 DIAGNOSIS — Z524 Kidney donor: Secondary | ICD-10-CM | POA: Insufficient documentation

## 2015-05-28 DIAGNOSIS — S91114A Laceration without foreign body of right lesser toe(s) without damage to nail, initial encounter: Secondary | ICD-10-CM | POA: Diagnosis not present

## 2015-05-28 DIAGNOSIS — Y9389 Activity, other specified: Secondary | ICD-10-CM | POA: Diagnosis not present

## 2015-05-28 DIAGNOSIS — S99921A Unspecified injury of right foot, initial encounter: Secondary | ICD-10-CM | POA: Diagnosis present

## 2015-05-28 DIAGNOSIS — Y9289 Other specified places as the place of occurrence of the external cause: Secondary | ICD-10-CM | POA: Diagnosis not present

## 2015-05-28 MED ORDER — LIDOCAINE HCL (PF) 1 % IJ SOLN
5.0000 mL | Freq: Once | INTRAMUSCULAR | Status: AC
  Administered 2015-05-28: 5 mL via INTRADERMAL
  Filled 2015-05-28: qty 5

## 2015-05-28 NOTE — Discharge Instructions (Signed)
Keep sutures clean and dry. Follow-up with primary care physician in 1 week for suture removal or sooner for any signs of infection.

## 2015-05-28 NOTE — ED Notes (Signed)
Pt was at [employer] buying wine when a bottle fell, broke and cut her right little toe.  Pt has foot wrapped at this time.

## 2015-05-28 NOTE — ED Provider Notes (Signed)
CSN: [id number]     Arrival date & time 05/28/15  1635 History  By signing my name below, I, Tanda Rockers, attest that this documentation has been prepared under the direction and in the presence of Sharilyn Sites, PA-C. Electronically Signed: Tanda Rockers, ED Scribe. 05/28/2015. 4:59 PM.  Chief Complaint  Patient presents with  . Toe Injury   The history is provided by the patient. No language interpreter was used.     HPI Comments: Stepheni Cameron is a 69 y.o. female who presents to the Emergency Department complaining of laceration to right 5th digit that occurred earlier today. Pt states that she was at [facility] when a bottle of wine fell on her foot and broke, slicing her toe. Pt bandaged foot PTA. She notes mild pain around the laceration but is otherwise complaint free. Denies numbness, weakness, tingling, or any other associated symptoms. Tetanus up to date.   Past Medical History  Diagnosis Date  . 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 Broken arms 2006    broke both arms in a fall  . Scoliosis 01/2013  . DDD (degenerative disc disease) 01/2013    lower spine  . Arthritis    Past Surgical History  Procedure Laterality Date  . Bunionectomy with hammertoe reconstruction  07/2005  . Abdominal hysterectomy    . Tonsillectomy     Family History  Problem Relation Age of Onset  . Diabetes Mother   . Hypertension Mother   . Heart disease Father    Social History  Substance Use Topics  . Smoking status: Former Smoker    Types: Cigarettes    Quit date: 03/19/1993  . Smokeless tobacco: Never Used  . Alcohol Use: 3.5 oz/week    7 drink(s) per week     Comment: 6-7 glasses of wine a week   OB History    Gravida Para Term Preterm AB TAB SAB Ectopic Multiple Living   0              Review of Systems  Musculoskeletal: Positive for arthralgias.  Skin: Positive for wound (Laceration to right 5th digit).  Neurological:  Negative for weakness and numbness.  All other systems reviewed and are negative.  Allergies  Shellfish allergy  Home Medications   Prior to Admission medications   Medication Sig Start Date End Date Taking? Authorizing Provider  BIOTIN PO Take by mouth daily.    Historical Provider, MD  Cholecalciferol (VITAMIN D) 2000 UNITS tablet Take 2,000 Units by mouth daily.    Historical Provider, MD  Coenzyme Q10-Levocarnitine (CO Q-10 PLUS PO) Take by mouth daily.    Historical Provider, MD  estradiol (ESTRACE) 0.5 MG tablet Take 1 tablet (0.5 mg total) by mouth daily. 04/30/15   Brook Rosalin Hawking, MD  Multiple Minerals-Vitamins (CALCIUM-MAGNESIUM-ZINC-D3 PO) Take by mouth daily.    Historical Provider, MD  Omega-3 Fatty Acids (OMEGA 3 PO) Take by mouth daily.    Historical Provider, MD  tolterodine (DETROL LA) 4 MG 24 hr capsule Take 1 capsule (4 mg total) by mouth daily. 04/30/15   Brook Rosalin Hawking, MD  valACYclovir (VALTREX) 500 MG tablet Take 1 tablet (500 mg total) by mouth daily. 04/30/15   Patton Salles, MD   Triage Vitals: BP 135/65 mmHg  Pulse 77  Temp(Src) 97.9 F (36.6 C) (Oral)  Resp 18  SpO2 99%  LMP 08/28/1974  Physical Exam  Constitutional: She is oriented to person, place, and time. She appears well-developed and well-nourished.  HENT:  Head: Normocephalic and atraumatic.  Mouth/Throat: Oropharynx is clear and moist.  Eyes: Conjunctivae and EOM are normal. Pupils are equal, round, and reactive to light.  Neck: Normal range of motion.  Cardiovascular: Normal rate, regular rhythm and normal heart sounds.   Pulmonary/Chest: Effort normal and breath sounds normal.  Abdominal: Soft. Bowel sounds are normal.  Musculoskeletal: Normal range of motion.  Superficial 1cm laceration to dorsum of right foot Semi-circular 1cm laceration to right 5th toe with minimal bleeding; full ROM maintained; no deformities noted; foot NVI  Neurological: She is alert and  oriented to person, place, and time.  Skin: Skin is warm and dry.  Psychiatric: She has a normal mood and affect.  Nursing note and vitals reviewed.   ED Course  Procedures (including critical care time)  LACERATION REPAIR Performed by: Garlon Hatchet Authorized by: Garlon Hatchet Consent: Verbal consent obtained. Risks and benefits: risks, benefits and alternatives were discussed Consent given by: patient Patient identity confirmed: provided demographic data Prepped and Draped in normal sterile fashion Wound explored  Laceration Location: right 5ht toe  Laceration Length: 3 cm  No Foreign Bodies seen or palpated  Anesthesia: local infiltration  Local anesthetic: lidocaine 1% without epinephrine  Anesthetic total: 3 ml  Irrigation method: syringe Amount of cleaning: standard  Skin closure: 4-0 prolene  Number of sutures: 3  Technique: simple interrupted  Patient tolerance: Patient tolerated the procedure well with no immediate complications.  LACERATION REPAIR Performed by: Garlon Hatchet Authorized by: Garlon Hatchet Consent: Verbal consent obtained. Risks and benefits: risks, benefits and alternatives were discussed Consent given by: patient Patient identity confirmed: provided demographic data Prepped and Draped in normal sterile fashion Wound explored  Laceration Location: dorsal right foot  Laceration Length: 1cm, superficial  No Foreign Bodies seen or palpated  Anesthesia: none  Local anesthetic: lnone  Anesthetic total: 0 ml  Irrigation method: syringe Amount of cleaning: standard  Skin closure: dermabond  Number of sutures: 0  Technique: n/a  Patient tolerance: Patient tolerated the procedure well with no immediate complications.   DIAGNOSTIC STUDIES: Oxygen Saturation is 99% on RA, normal by my interpretation.    COORDINATION OF CARE: 4:58 PM-Discussed treatment plan which includes suture placement with pt at bedside and pt  agreed to plan.   Labs Review Labs Reviewed - No data to display  Imaging Review No results found.    EKG Interpretation None      MDM   Final diagnoses:  Toe laceration, initial encounter   69 year old female here with laceration of right foot. Bottle of wine fell on her foot at [employer].  Lacerations are uncomplicated.  Offered x-ray to evaluate for fracture of 5ht digit, patient declined.  Tetanus UTD.  Lacerations repaired as above, patient tolerated well.  Discussed home wound care. Patient to follow-up with her PCP in one week for suture removal or sooner for any complications.  Discussed plan with patient, he/she acknowledged understanding and agreed with plan of care.  Return precautions given for new or worsening symptoms.  I personally performed the services described in this documentation, which was scribed in my presence. The recorded information has been reviewed and is accurate.     Garlon Hatchet, PA-C 05/28/15 1739  Richardean Canal, MD 05/28/15 [phone]

## 2015-05-28 NOTE — ED Notes (Signed)
Pt comfortable in room, lidocaine in room for provider

## 2016-01-10 ENCOUNTER — Other Ambulatory Visit: Payer: Self-pay

## 2016-01-10 DIAGNOSIS — Z1231 Encounter for screening mammogram for malignant neoplasm of breast: Secondary | ICD-10-CM

## 2016-02-22 ENCOUNTER — Ambulatory Visit: Payer: Medicare Other

## 2016-03-09 ENCOUNTER — Ambulatory Visit
Admission: RE | Admit: 2016-03-09 | Discharge: 2016-03-09 | Disposition: A | Discharge status: Home / Self Care | Payer: Medicare Other | Source: Ambulatory Visit

## 2016-03-09 DIAGNOSIS — Z1231 Encounter for screening mammogram for malignant neoplasm of breast: Secondary | ICD-10-CM

## 2016-05-10 ENCOUNTER — Ambulatory Visit: Payer: Medicare Other | Admitting: Obstetrics and Gynecology

## 2016-05-17 ENCOUNTER — Other Ambulatory Visit: Payer: Self-pay | Admitting: Family Medicine

## 2016-05-17 ENCOUNTER — Ambulatory Visit: Payer: Medicare Other | Admitting: Obstetrics and Gynecology

## 2016-05-17 DIAGNOSIS — M8580 Other specified disorders of bone density and structure, unspecified site: Secondary | ICD-10-CM

## 2016-05-26 ENCOUNTER — Ambulatory Visit
Admission: RE | Admit: 2016-05-26 | Discharge: 2016-05-26 | Disposition: A | Discharge status: Home / Self Care | Payer: Medicare Other | Source: Ambulatory Visit | Attending: Family Medicine | Admitting: Family Medicine

## 2016-05-26 DIAGNOSIS — M8580 Other specified disorders of bone density and structure, unspecified site: Secondary | ICD-10-CM

## 2016-06-05 ENCOUNTER — Ambulatory Visit (INDEPENDENT_AMBULATORY_CARE_PROVIDER_SITE_OTHER): Payer: Medicare Other | Admitting: Obstetrics and Gynecology

## 2016-06-05 ENCOUNTER — Encounter: Payer: Self-pay | Admitting: Obstetrics and Gynecology

## 2016-06-05 VITALS — BP 116/68 | HR 68 | Resp 16 | Ht 62.5 in | Wt 135.0 lb

## 2016-06-05 DIAGNOSIS — Z01419 Encounter for gynecological examination (general) (routine) without abnormal findings: Secondary | ICD-10-CM

## 2016-06-05 MED ORDER — TOLTERODINE TARTRATE ER 4 MG PO CP24: 4.0000 mg | ORAL_CAPSULE | Freq: Every day | ORAL | 3 refills | Status: DC

## 2016-06-05 MED ORDER — VALACYCLOVIR HCL 500 MG PO TABS: 500.0000 mg | ORAL_TABLET | Freq: Every day | ORAL | 3 refills | Status: DC

## 2016-06-05 NOTE — Progress Notes (Signed)
70 y.o. G75P0 Divorced Caucasian female here for annual exam.    Off her ERT just after her visit last year.  Happy to be off them.   Needs refill of Detrol LA.  Has been taking for a long time and wants to eventually stop it.  Voiding every 5 - 6 hours. NF once per night.  No dry mouth or constipation.   Also needs Valtrex.  Taking this every other day and doing well.   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.  Went to [place] in August.   PCP:   Kristen Adams  Patient's last menstrual period was 08/28/1974.           Sexually active: No.  The current method of family planning is status post hysterectomy.    Exercising: Yes.    yoga & walking Smoker:  no  Health Maintenance: Pap:  04-30-15 neg History of abnormal Pap:  Yes, dysplasia~55yrs ago MMG:  03-09-16 category c density birads 1:neg Colonoscopy:  2014 normal BMD:   2017 Result:  normal TDaP:  2012 HIV: not done Hep C: neg 2017 Screening Labs:  Hb today: pcp, Urine today: pcp Self breast exam: done monthly   reports that she quit smoking about 23 years ago. Her smoking use included Cigarettes. She has never used smokeless tobacco. She reports that she drinks about 3.0 oz of alcohol per week . She reports that she does not use drugs.  Past Medical History:  Diagnosis Date  . Arthritis   . Broken arms 2006   broke both arms in a fall  . DDD (degenerative disc disease) 01/2013   lower spine  . 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 Scoliosis 01/2013  . STD (sexually transmitted disease)    HSV right buttock    Past Surgical History:  Procedure Laterality Date  . ABDOMINAL HYSTERECTOMY    . BUNIONECTOMY WITH HAMMERTOE RECONSTRUCTION  07/2005  . TONSILLECTOMY      Current Outpatient Prescriptions  Medication Sig Dispense Refill  . BIOTIN PO Take by mouth daily.    . Cholecalciferol (VITAMIN D) 2000 UNITS tablet Take 2,000 Units by mouth daily.    . Coenzyme Q10-Levocarnitine (CO Q-10 PLUS PO) Take by mouth daily.    . Multiple  Minerals-Vitamins (CALCIUM-MAGNESIUM-ZINC-D3 PO) Take by mouth daily.    . Nutritional Supplements (ESTROVEN PO) Take by mouth daily.    . Omega-3 Fatty Acids (OMEGA 3 PO) Take by mouth daily.    Marland Kitchen tolterodine (DETROL LA) 4 MG 24 hr capsule Take 1 capsule (4 mg total) by mouth daily. 90 capsule 3  . valACYclovir (VALTREX) 500 MG tablet Take 1 tablet (500 mg total) by mouth daily. 90 tablet 3   No current facility-administered medications for this visit.     Family History  Problem Relation Age of Onset  . Diabetes Mother   . Hypertension Mother   . Heart disease Father     ROS:  Pertinent items are noted in HPI.  Otherwise, a comprehensive ROS was negative.  Exam:   BP 116/68   Pulse 68   Resp 16   Ht 5' 2.5" (1.588 m)   Wt 135 lb (61.2 kg)   LMP 08/28/1974   BMI 24.30 kg/m     General appearance: alert, cooperative and appears stated age Head: Normocephalic, without obvious abnormality, atraumatic Neck: no adenopathy, supple, symmetrical, trachea midline and thyroid normal to inspection and palpation Lungs: clear to auscultation bilaterally Breasts: normal appearance, no masses or tenderness, No  nipple retraction or dimpling, No nipple discharge or bleeding, No axillary or supraclavicular adenopathy Heart: regular rate and rhythm Abdomen: soft, non-tender; no masses, no organomegaly Extremities: extremities normal, atraumatic, no cyanosis or edema Skin: Skin color, texture, turgor normal. No rashes or lesions Lymph nodes: Cervical, supraclavicular, and axillary nodes normal. No abnormal inguinal nodes palpated Neurologic: Grossly normal  Pelvic: External genitalia:  no lesions              Urethra:  normal appearing urethra with no masses, tenderness or lesions              Bartholins and Skenes: normal                 Vagina: normal appearing vagina with normal color and discharge, no lesions.  Minor signs of atrophy noted.              Cervix:  bsent              Pap  taken: No. Bimanual Exam:  Uterus:  Absent.              Adnexa: no mass, fullness, tenderness              Rectal exam: Yes.  .  Confirms.              Anus:  normal sphincter tone, no lesions  Chaperone was present for exam.  Assessment:   Well woman visit with normal exam. Status post hysterectomy for cervical dysplasia in 1976.   History of HSV. Overactive bladder.  On Detrol LA. Kidney donor in 2002.  Plan: Yearly mammogram recommended after age 70.  Recommended self breast exam.  Pap and HR HPV as above. Discussed Calcium, Vitamin D, regular exercise program including cardiovascular and weight bearing exercise. Refill of Valtrex and Detrol LA.  She may try to wean off the Detrol LA. Labs with PCP. Follow up annually and prn.       After visit summary provided.

## 2016-06-06 ENCOUNTER — Encounter: Payer: Self-pay | Admitting: Obstetrics and Gynecology

## 2017-02-12 ENCOUNTER — Other Ambulatory Visit: Payer: Self-pay | Admitting: Obstetrics and Gynecology

## 2017-02-12 DIAGNOSIS — Z1231 Encounter for screening mammogram for malignant neoplasm of breast: Secondary | ICD-10-CM

## 2017-03-12 ENCOUNTER — Ambulatory Visit
Admission: RE | Admit: 2017-03-12 | Discharge: 2017-03-12 | Disposition: A | Discharge status: Home / Self Care | Payer: Medicare Other | Source: Ambulatory Visit | Attending: Obstetrics and Gynecology | Admitting: Obstetrics and Gynecology

## 2017-03-12 DIAGNOSIS — Z1231 Encounter for screening mammogram for malignant neoplasm of breast: Secondary | ICD-10-CM

## 2017-06-27 ENCOUNTER — Ambulatory Visit: Payer: Medicare Other | Admitting: Obstetrics and Gynecology

## 2017-07-16 ENCOUNTER — Other Ambulatory Visit: Payer: Self-pay | Admitting: Obstetrics and Gynecology

## 2017-07-16 NOTE — Telephone Encounter (Signed)
Medication refill request: Valtrex 500mg  #90 and Detrol LA #90 Last AEX:  06-05-16 Next AEX: 07-31-17 Last MMG (if hormonal medication request): 03-12-17 Neg/BiRads1:TBC Refill authorized: Valtrex #30, NR and Detrol LA #30, NR--Routing to provider

## 2017-07-18 ENCOUNTER — Ambulatory Visit: Payer: Medicare Other | Admitting: Obstetrics and Gynecology

## 2017-07-31 ENCOUNTER — Ambulatory Visit: Payer: Medicare Other | Admitting: Obstetrics and Gynecology

## 2017-08-09 ENCOUNTER — Telehealth: Payer: Self-pay | Admitting: Obstetrics and Gynecology

## 2017-08-09 NOTE — Telephone Encounter (Signed)
Left message for patient to reschedule 08/29/17 appt with Dr Silva °

## 2017-08-13 NOTE — Telephone Encounter (Signed)
Left message to confirm patient aware appt cancelled

## 2017-08-29 ENCOUNTER — Ambulatory Visit: Payer: Medicare Other | Admitting: Obstetrics and Gynecology

## 2017-08-31 ENCOUNTER — Other Ambulatory Visit: Payer: Self-pay

## 2017-08-31 ENCOUNTER — Encounter: Payer: Self-pay | Admitting: Obstetrics and Gynecology

## 2017-08-31 ENCOUNTER — Ambulatory Visit (INDEPENDENT_AMBULATORY_CARE_PROVIDER_SITE_OTHER): Payer: Medicare Other | Admitting: Obstetrics and Gynecology

## 2017-08-31 VITALS — BP 132/68 | HR 80 | Resp 14 | Ht 62.0 in | Wt 133.6 lb

## 2017-08-31 DIAGNOSIS — N3281 Overactive bladder: Secondary | ICD-10-CM | POA: Diagnosis not present

## 2017-08-31 DIAGNOSIS — Z01419 Encounter for gynecological examination (general) (routine) without abnormal findings: Secondary | ICD-10-CM

## 2017-08-31 MED ORDER — TOLTERODINE TARTRATE ER 4 MG PO CP24: 4.0000 mg | ORAL_CAPSULE | Freq: Every day | ORAL | 4 refills | Status: DC

## 2017-08-31 MED ORDER — VALACYCLOVIR HCL 500 MG PO TABS: 500.0000 mg | ORAL_TABLET | Freq: Every day | ORAL | 4 refills | Status: DC

## 2017-08-31 NOTE — Progress Notes (Signed)
[name] here for annual exam.  H/O TAH. Not sexually active, no desire. Happy on her own. Has good friends.  On Detrol LA for OAB. She has tried going off of it and had urgency, some urge incontinence.     Patient's last menstrual period was 08/28/1974.          Sexually active: No.  The current method of family planning is status post hysterectomy--still has ovaries.    Exercising: No.  Stretches daily and yoga 2x/week Smoker:  no  Health Maintenance: Pap:  04-30-15 neg, 04-02-14 Neg History of abnormal Pap:  Yes, Hx dysplasia 50 yrs ago MMG:  03-12-17 category c density birads 1:neg Colonoscopy:  2014 normal BMD:   2017 normal with TBC TDaP:  2012 Hep c neg 2017 per patient   reports that she quit smoking about 24 years ago. Her smoking use included cigarettes. she has never used smokeless tobacco. She reports that she drinks about 3.6 oz of alcohol per week. She reports that she does not use drugs. No children, she has 3 cats.   Past Medical History:  Diagnosis Date  . Arthritis   . Broken arms 2006   broke both arms in a fall  . DDD (degenerative disc disease) 01/2013   lower spine  . 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 Scoliosis 01/2013  . STD (sexually transmitted disease)    HSV right buttock    Past Surgical History:  Procedure Laterality Date  . ABDOMINAL HYSTERECTOMY    . BUNIONECTOMY WITH HAMMERTOE RECONSTRUCTION  07/2005  . TONSILLECTOMY      Current Outpatient Medications  Medication Sig Dispense Refill  . BIOTIN PO Take by mouth daily.    . Cholecalciferol (VITAMIN D) 2000 UNITS tablet Take 2,000 Units by mouth daily.    . Coenzyme Q10-Levocarnitine (CO Q-10 PLUS PO) Take by mouth daily.    . Magnesium 250 MG TABS Take 1 tablet by mouth daily.    . Multiple Minerals-Vitamins (CALCIUM-MAGNESIUM-ZINC-D3 PO) Take by mouth daily.    . Nutritional Supplements (ESTROVEN PO) Take by mouth daily.    . Omega-3 Fatty Acids (OMEGA 3 PO) Take by mouth daily.     Marland Kitchen tolterodine (DETROL LA) 4 MG 24 hr capsule TAKE 1 CAPSULE (4 MG TOTAL) BY MOUTH DAILY. 30 capsule 0  . valACYclovir (VALTREX) 500 MG tablet TAKE 1 TABLET (500 MG TOTAL) BY MOUTH DAILY. 30 tablet 0   No current facility-administered medications for this visit.     Family History  Problem Relation Age of Onset  . Diabetes Mother   . Hypertension Mother   . Heart disease Father     Review of Systems  Eyes: Negative.   Respiratory: Negative.   Cardiovascular: Negative.   Gastrointestinal: Negative.   Endocrine: Negative.   Genitourinary: Positive for urgency.       Nocturia  Musculoskeletal: Positive for arthralgias.  Skin: Negative.   Allergic/Immunologic: Negative.   Neurological:       Excessive crying at times  Hematological: Negative.   Psychiatric/Behavioral: Negative.     Exam:   BP 132/68 (BP Location: Right Arm, Patient Position: Sitting, Cuff Size: Normal)   Pulse 80   Resp 14   Ht 5\' 2"  (1.575 m)   Wt 133 lb 9.6 oz (60.6 kg)   LMP 08/28/1974   BMI 24.44 kg/m   Weight change: @WEIGHTCHANGE @ Height:   Height: 5\' 2"  (157.5 cm)  Ht Readings from Last 3 Encounters:  08/31/17 5'  2" (1.575 m)  06/05/16 5' 2.5" (1.588 m)  04/30/15 5' 2.5" (1.588 m)    General appearance: alert, cooperative and appears stated age Head: Normocephalic, without obvious abnormality, atraumatic Neck: no adenopathy, supple, symmetrical, trachea midline and thyroid normal to inspection and palpation Lungs: clear to auscultation bilaterally Cardiovascular: regular rate and rhythm Breasts: normal appearance, no masses or tenderness Abdomen: soft, non-tender; non distended,  no masses,  no organomegaly Extremities: extremities normal, atraumatic, no cyanosis or edema Skin: Skin color, texture, turgor normal. No rashes or lesions Lymph nodes: Cervical, supraclavicular, and axillary nodes normal. No abnormal inguinal nodes palpated Neurologic: Grossly normal   Pelvic: External  genitalia:  no lesions              Urethra:  normal appearing urethra with no masses, tenderness or lesions              Bartholins and Skenes: normal                 Vagina: normal appearing atrophic vagina with normal color and discharge, no lesions              Cervix: absent               Bimanual Exam:  Uterus:  uterus absent              Adnexa: no mass, fullness, tenderness               Rectovaginal: Confirms               Anus:  normal sphincter tone, no lesions  Chaperone was present for exam.  A:  Well Woman with normal exam  OAB, controlled with detrol  Takes Valtrex for HSV suppression, hasn't had an outbreak in a long time, discussed coming off of it  P:   Pap next year  Mammogram in the summer  DEXA normal  Colonoscopy UTD  Discussed breast self exam  Discussed calcium and vit D intake  Labs with her primary

## 2017-08-31 NOTE — Patient Instructions (Signed)

## 2017-11-23 ENCOUNTER — Telehealth: Payer: Self-pay | Admitting: Obstetrics and Gynecology

## 2017-11-23 NOTE — Telephone Encounter (Signed)
Patient would like to speak nurse about a medication change.

## 2017-11-23 NOTE — Telephone Encounter (Signed)
Spoke with patient. States tolterodine ER (Detrol LA) now 3rd tier medication, not affordable, requesting alternative. Was advised tolterodine IR is covered. Advised Dr. Oscar LaJertson is out of the office, will review on  4/1 and return call, patient agreeable.  Dr. Oscar LaJertson, please review and advise/

## 2017-11-26 MED ORDER — TOLTERODINE TARTRATE 2 MG PO TABS: 2.0000 mg | ORAL_TABLET | Freq: Two times a day (BID) | ORAL | 2 refills | Status: DC

## 2017-11-26 NOTE — Telephone Encounter (Signed)
Detrol IR sent to her pharmacy, please inform.

## 2017-11-27 NOTE — Telephone Encounter (Signed)
Spoke with patient, advised as seen below per Dr. Jertson. Patient verbalizes understanding and is agreeable. Will close encounter.  

## 2018-02-26 ENCOUNTER — Other Ambulatory Visit: Payer: Self-pay | Admitting: Obstetrics and Gynecology

## 2018-02-26 DIAGNOSIS — Z1231 Encounter for screening mammogram for malignant neoplasm of breast: Secondary | ICD-10-CM

## 2018-03-18 ENCOUNTER — Ambulatory Visit
Admission: RE | Admit: 2018-03-18 | Discharge: 2018-03-18 | Disposition: A | Discharge status: Home / Self Care | Payer: Medicare Other | Source: Ambulatory Visit | Attending: Obstetrics and Gynecology | Admitting: Obstetrics and Gynecology

## 2018-03-18 DIAGNOSIS — Z1231 Encounter for screening mammogram for malignant neoplasm of breast: Secondary | ICD-10-CM

## 2018-08-09 ENCOUNTER — Encounter

## 2018-09-09 DIAGNOSIS — M25561 Pain in right knee: Secondary | ICD-10-CM | POA: Insufficient documentation

## 2018-09-24 NOTE — Progress Notes (Signed)
73 y.o. G0P0 Divorced White or Caucasian Not Hispanic or Latino female here for annual exam.  H/O TAH. On Detrol for OAB, stable.  Not sexually active.  She has been weaning off of the Valtrex, used to take it daily. She will call if she needs a refill. Hasn't had a genital HSV outbreak for a long time.     Patient's last menstrual period was 08/28/1974.          Sexually active: No.  The current method of family planning is post hysterectomy.  Exercising: Yes.    yoga, walking, gym Smoker:  no  Health Maintenance: Pap:  04-30-15 neg, 04-02-14 Neg History of abnormal Pap:  Yes, Hx dysplasia 50 yrs ago MMG:  03/18/2018 Birads 1 negative Colonoscopy:  2014 normal BMD:   2017 normal with TBC TDaP:  2012   reports that she quit smoking about 25 years ago. Her smoking use included cigarettes. She has never used smokeless tobacco. She reports current alcohol use of about 6.0 standard drinks of alcohol per week. She reports that she does not use drugs. retired Child psychotherapist, volunteers at [employer].   Past Medical History:  Diagnosis Date  . Arthritis   . Broken arms 2006   broke both arms in a fall  . DDD (degenerative disc disease) 01/2013   lower spine  . 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 Overactive bladder   . Scoliosis 01/2013  . STD (sexually transmitted disease)    HSV right buttock    Past Surgical History:  Procedure Laterality Date  . ABDOMINAL HYSTERECTOMY    . BUNIONECTOMY WITH HAMMERTOE RECONSTRUCTION  07/2005  . TONSILLECTOMY      Current Outpatient Medications  Medication Sig Dispense Refill  . BIOTIN PO Take by mouth daily.    . Cholecalciferol (VITAMIN D) 2000 UNITS tablet Take 2,000 Units by mouth daily.    . Coenzyme Q10-Levocarnitine (CO Q-10 PLUS PO) Take by mouth daily.    . Multiple Vitamins-Minerals (PRESERVISION AREDS 2 PO) Take by mouth 2 (two) times daily.    . Nutritional Supplements (ESTROVEN PO) Take by mouth daily.    . Omega-3 Fatty Acids (OMEGA 3 PO) Take by  mouth daily.    Marland Kitchen tolterodine (DETROL) 2 MG tablet Take 1 tablet (2 mg total) by mouth 2 (two) times daily. 180 tablet 2  . valACYclovir (VALTREX) 500 MG tablet Take 1 tablet (500 mg total) by mouth daily. May increase to 1 tablet BID x 3 days as needed 90 tablet 4   No current facility-administered medications for this visit.     Family History  Problem Relation Age of Onset  . Diabetes Mother   . Hypertension Mother   . Heart disease Father     Review of Systems  Constitutional: Negative.   HENT: Negative.   Eyes: Negative.   Respiratory: Negative.   Cardiovascular: Negative.   Gastrointestinal: Negative.   Endocrine: Negative.   Genitourinary: Positive for frequency.       Nocturia  Musculoskeletal: Negative.   Skin: Negative.   Allergic/Immunologic: Negative.   Neurological: Negative.   Hematological: Bruises/bleeds easily.  Psychiatric/Behavioral: Negative.     Exam:   BP 120/68 (BP Location: Right Arm, Patient Position: Sitting, Cuff Size: Normal)   Pulse 80   Ht 5\' 2"  (1.575 m)   Wt 130 lb (59 kg)   LMP 08/28/1974   BMI 23.78 kg/m   Weight change: @WEIGHTCHANGE @ Height:   Height: 5\' 2"  (157.5 cm)  Ht  Readings from Last 3 Encounters:  09/25/18 5\' 2"  (1.575 m)  08/31/17 5\' 2"  (1.575 m)  06/05/16 5' 2.5" (1.588 m)    General appearance: alert, cooperative and appears stated age Head: Normocephalic, without obvious abnormality, atraumatic Neck: no adenopathy, supple, symmetrical, trachea midline and thyroid normal to inspection and palpation Lungs: clear to auscultation bilaterally Cardiovascular: regular rate and rhythm Breasts: normal appearance, no masses or tenderness Abdomen: soft, non-tender; non distended,  no masses,  no organomegaly Extremities: extremities normal, atraumatic, no cyanosis or edema Skin: Skin color, texture, turgor normal. No rashes or lesions Lymph nodes: Cervical, supraclavicular, and axillary nodes normal. No abnormal inguinal  nodes palpated Neurologic: Grossly normal   Pelvic: External genitalia:  no lesions              Urethra:  normal appearing urethra with no masses, tenderness or lesions              Bartholins and Skenes: normal                 Vagina: normal appearing vagina with normal color and discharge, no lesions              Cervix: absent               Bimanual Exam:  Uterus:  uterus absent              Adnexa: no mass, fullness, tenderness               Rectovaginal: Confirms               Anus:  normal sphincter tone, no lesions  Chaperone was present for exam.  A:  Well Woman with normal exam  H/o TAH, distant h/o cervical dysplasia  P:   Pap with reflex hpv  Discussed breast self exam  Discussed calcium and vit D intake  Labs with primary  Mammogram and colonoscopy are UTD

## 2018-09-25 ENCOUNTER — Other Ambulatory Visit: Payer: Self-pay

## 2018-09-25 ENCOUNTER — Ambulatory Visit (INDEPENDENT_AMBULATORY_CARE_PROVIDER_SITE_OTHER): Payer: Medicare Other | Admitting: Obstetrics and Gynecology

## 2018-09-25 ENCOUNTER — Encounter: Payer: Self-pay | Admitting: Obstetrics and Gynecology

## 2018-09-25 ENCOUNTER — Other Ambulatory Visit (HOSPITAL_COMMUNITY)
Admission: RE | Admit: 2018-09-25 | Discharge: 2018-09-25 | Disposition: A | Discharge status: Home / Self Care | Payer: Medicare Other | Source: Ambulatory Visit | Attending: Obstetrics and Gynecology | Admitting: Obstetrics and Gynecology

## 2018-09-25 VITALS — BP 120/68 | HR 80 | Ht 62.0 in | Wt 130.0 lb

## 2018-09-25 DIAGNOSIS — Z01419 Encounter for gynecological examination (general) (routine) without abnormal findings: Secondary | ICD-10-CM

## 2018-09-25 DIAGNOSIS — Z1272 Encounter for screening for malignant neoplasm of vagina: Secondary | ICD-10-CM | POA: Diagnosis present

## 2018-09-25 NOTE — Patient Instructions (Signed)
EXERCISE AND DIET:  We recommended that you start or continue a regular exercise program for good health. Regular exercise means any activity that makes your heart beat faster and makes you sweat.  We recommend exercising at least 30 minutes per day at least 3 days a week, preferably 4 or 5.  We also recommend a diet low in fat and sugar.  Inactivity, poor dietary choices and obesity can cause diabetes, heart attack, stroke, and kidney damage, among others.    ALCOHOL AND SMOKING:  Women should limit their alcohol intake to no more than 7 drinks/beers/glasses of wine (combined, not each!) per week. Moderation of alcohol intake to this level decreases your risk of breast cancer and liver damage. And of course, no recreational drugs are part of a healthy lifestyle.  And absolutely no smoking or even second hand smoke. Most people know smoking can cause heart and lung diseases, but did you know it also contributes to weakening of your bones? Aging of your skin?  Yellowing of your teeth and nails?  CALCIUM AND VITAMIN D:  Adequate intake of calcium and Vitamin D are recommended.  The recommendations for exact amounts of these supplements seem to change often, but generally speaking 1,200 mg of calcium (between diet and supplement) and 800 units of Vitamin D per day seems prudent. Certain women may benefit from higher intake of Vitamin D.  If you are among these women, your doctor will have told you during your visit.    PAP SMEARS:  Pap smears, to check for cervical cancer or precancers,  have traditionally been done yearly, although recent scientific advances have shown that most women can have pap smears less often.  However, every woman still should have a physical exam from her gynecologist every year. It will include a breast check, inspection of the vulva and vagina to check for abnormal growths or skin changes, a visual exam of the cervix, and then an exam to evaluate the size and shape of the uterus and  ovaries.  And after 75 years of age, a rectal exam is indicated to check for rectal cancers. We will also provide age appropriate advice regarding health maintenance, like when you should have certain vaccines, screening for sexually transmitted diseases, bone density testing, colonoscopy, mammograms, etc.   MAMMOGRAMS:  All women over 40 years old should have a yearly mammogram. Many facilities now offer a "3D" mammogram, which may cost around $50 extra out of pocket. If possible,  we recommend you accept the option to have the 3D mammogram performed.  It both reduces the number of women who will be called back for extra views which then turn out to be normal, and it is better than the routine mammogram at detecting truly abnormal areas.    COLON CANCER SCREENING: Now recommend starting at age 45. At this time colonoscopy is not covered for routine screening until 50. There are take home tests that can be done between 45-49.   COLONOSCOPY:  Colonoscopy to screen for colon cancer is recommended for all women at age 50.  We know, you hate the idea of the prep.  We agree, BUT, having colon cancer and not knowing it is worse!!  Colon cancer so often starts as a polyp that can be seen and removed at colonscopy, which can quite literally save your life!  And if your first colonoscopy is normal and you have no family history of colon cancer, most women don't have to have it again for   10 years.  Once every ten years, you can do something that may end up saving your life, right?  We will be happy to help you get it scheduled when you are ready.  Be sure to check your insurance coverage so you understand how much it will cost.  It may be covered as a preventative service at no cost, but you should check your particular policy.      Breast Self-Awareness Breast self-awareness means being familiar with how your breasts look and feel. It involves checking your breasts regularly and reporting any changes to your  health care provider. Practicing breast self-awareness is important. A change in your breasts can be a sign of a serious medical problem. Being familiar with how your breasts look and feel allows you to find any problems early, when treatment is more likely to be successful. All women should practice breast self-awareness, including women who have had breast implants. How to do a breast self-exam One way to learn what is normal for your breasts and whether your breasts are changing is to do a breast self-exam. To do a breast self-exam: Look for Changes  1. Remove all the clothing above your waist. 2. Stand in front of a mirror in a room with good lighting. 3. Put your hands on your hips. 4. Push your hands firmly downward. 5. Compare your breasts in the mirror. Look for differences between them (asymmetry), such as: ? Differences in shape. ? Differences in size. ? Puckers, dips, and bumps in one breast and not the other. 6. Look at each breast for changes in your skin, such as: ? Redness. ? Scaly areas. 7. Look for changes in your nipples, such as: ? Discharge. ? Bleeding. ? Dimpling. ? Redness. ? A change in position. Feel for Changes Carefully feel your breasts for lumps and changes. It is best to do this while lying on your back on the floor and again while sitting or standing in the shower or tub with soapy water on your skin. Feel each breast in the following way:  Place the arm on the side of the breast you are examining above your head.  Feel your breast with the other hand.  Start in the nipple area and make  inch (2 cm) overlapping circles to feel your breast. Use the pads of your three middle fingers to do this. Apply light pressure, then medium pressure, then firm pressure. The light pressure will allow you to feel the tissue closest to the skin. The medium pressure will allow you to feel the tissue that is a little deeper. The firm pressure will allow you to feel the tissue  close to the ribs.  Continue the overlapping circles, moving downward over the breast until you feel your ribs below your breast.  Move one finger-width toward the center of the body. Continue to use the  inch (2 cm) overlapping circles to feel your breast as you move slowly up toward your collarbone.  Continue the up and down exam using all three pressures until you reach your armpit.  Write Down What You Find  Write down what is normal for each breast and any changes that you find. Keep a written record with breast changes or normal findings for each breast. By writing this information down, you do not need to depend only on memory for size, tenderness, or location. Write down where you are in your menstrual cycle, if you are still menstruating. If you are having trouble noticing differences   in your breasts, do not get discouraged. With time you will become more familiar with the variations in your breasts and more comfortable with the exam. How often should I examine my breasts? Examine your breasts every month. If you are breastfeeding, the best time to examine your breasts is after a feeding or after using a breast pump. If you menstruate, the best time to examine your breasts is 5-7 days after your period is over. During your period, your breasts are lumpier, and it may be more difficult to notice changes. When should I see my health care provider? See your health care provider if you notice:  A change in shape or size of your breasts or nipples.  A change in the skin of your breast or nipples, such as a reddened or scaly area.  Unusual discharge from your nipples.  A lump or thick area that was not there before.  Pain in your breasts.  Anything that concerns you.  

## 2018-09-27 LAB — CYTOLOGY - PAP: Diagnosis: NEGATIVE

## 2019-02-17 ENCOUNTER — Other Ambulatory Visit: Payer: Self-pay | Admitting: Obstetrics and Gynecology

## 2019-02-17 DIAGNOSIS — Z1231 Encounter for screening mammogram for malignant neoplasm of breast: Secondary | ICD-10-CM

## 2019-04-04 ENCOUNTER — Ambulatory Visit: Payer: Medicare Other

## 2019-05-12 ENCOUNTER — Ambulatory Visit
Admission: RE | Admit: 2019-05-12 | Discharge: 2019-05-12 | Disposition: A | Discharge status: Home / Self Care | Payer: Medicare Other | Source: Ambulatory Visit | Attending: Obstetrics and Gynecology | Admitting: Obstetrics and Gynecology

## 2019-05-12 ENCOUNTER — Other Ambulatory Visit: Payer: Self-pay

## 2019-05-12 DIAGNOSIS — Z1231 Encounter for screening mammogram for malignant neoplasm of breast: Secondary | ICD-10-CM

## 2019-07-03 ENCOUNTER — Other Ambulatory Visit: Payer: Self-pay | Admitting: Family Medicine

## 2019-07-03 DIAGNOSIS — E785 Hyperlipidemia, unspecified: Secondary | ICD-10-CM

## 2019-07-11 ENCOUNTER — Ambulatory Visit
Admission: RE | Admit: 2019-07-11 | Discharge: 2019-07-11 | Disposition: A | Discharge status: Home / Self Care | Payer: Medicare Other | Source: Ambulatory Visit | Attending: Family Medicine | Admitting: Family Medicine

## 2019-07-11 DIAGNOSIS — E785 Hyperlipidemia, unspecified: Secondary | ICD-10-CM

## 2019-08-26 DIAGNOSIS — M4180 Other forms of scoliosis, site unspecified: Secondary | ICD-10-CM | POA: Insufficient documentation

## 2019-08-26 DIAGNOSIS — M4150 Other secondary scoliosis, site unspecified: Secondary | ICD-10-CM | POA: Insufficient documentation

## 2019-08-29 HISTORY — PX: CARDIAC CATHETERIZATION: SHX172

## 2019-09-09 ENCOUNTER — Telehealth: Payer: Self-pay

## 2019-09-11 NOTE — Telephone Encounter (Signed)
ERROR

## 2019-09-12 ENCOUNTER — Encounter: Payer: Self-pay | Admitting: Cardiovascular Disease

## 2019-09-12 ENCOUNTER — Ambulatory Visit: Payer: Medicare PPO | Admitting: Cardiovascular Disease

## 2019-09-12 ENCOUNTER — Other Ambulatory Visit: Payer: Self-pay

## 2019-09-12 DIAGNOSIS — E7800 Pure hypercholesterolemia, unspecified: Secondary | ICD-10-CM

## 2019-09-12 DIAGNOSIS — I700 Atherosclerosis of aorta: Secondary | ICD-10-CM | POA: Insufficient documentation

## 2019-09-12 DIAGNOSIS — E785 Hyperlipidemia, unspecified: Secondary | ICD-10-CM | POA: Insufficient documentation

## 2019-09-12 NOTE — Progress Notes (Signed)
Cardiology Office Note:    Date:  09/12/2019   ID:  Kristen Adams, DOB Oct 09, 1945, MRN [id number]  PCP:  Sigmund Hazel, MD  Cardiologist:  Nahser  Electrophysiologist:  None   Referring MD: Sigmund Hazel, MD   Chief Complaint  Patient presents with  . Hyperlipidemia    History of Present Illness:    Kristen Adams is a 74 y.o. female with a hx of hyperlipidemia.  She recently had a coronary calcium score CT scan and there was a comment of Athar aortic atherosclerosis.  We were asked to see her by Dr. Hyacinth Meeker for further evaluation of this aortic atherosclerosis   Pt has no cardiac complaints  Exercises regularly ,  Yoga and walks regularly . No limitations in her exercise Trying to stay safe from covid   Former smoker  Wine occasionally  + family hx  - mother had CAD / angina Aunts had cardiac issues Father committed suidcide  , was told her had a heart murmur   Past Medical History:  Diagnosis Date  . Aortic atherosclerosis (HCC)   . Arthritis   . Broken arms 2006   broke both arms in a fall  . DDD (degenerative disc disease) 01/2013   lower spine  . Depression   . History of tobacco abuse   . Hyperlipidemia   . 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 Kidney donor   . Overactive bladder   . Scoliosis 01/2013  . Screening for diabetes mellitus   . Single kidney   . 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 Vitamin D deficiency     Past Surgical History:  Procedure Laterality Date  . ABDOMINAL HYSTERECTOMY    . BUNIONECTOMY WITH HAMMERTOE RECONSTRUCTION  07/2005  . TONSILLECTOMY      Current Medications: Current Meds  Medication Sig  . BIOTIN PO Take by mouth daily.  Marland Kitchen CALCIUM & MAGNESIUM CARBONATES PO Take by mouth daily.  . Cholecalciferol (VITAMIN D) 2000 UNITS tablet Take 2,000 Units by mouth daily.  . Coenzyme Q10-Levocarnitine (CO Q-10 PLUS PO) Take by mouth daily.  . Multiple Vitamins-Minerals (PRESERVISION AREDS 2 PO) Take by mouth 2  (two) times daily.  . Nutritional Supplements (ESTROVEN PO) Take by mouth daily.  . Omega-3 Fatty Acids (OMEGA 3 PO) Take by mouth daily.  . valACYclovir (VALTREX) 500 MG tablet Take 1 tablet (500 mg total) by mouth daily. May increase to 1 tablet BID x 3 days as needed     Allergies:   Shellfish allergy   Social History   Socioeconomic History  . Marital status: Divorced    Spouse name: Not on file  . Number of children: Not on file  . Years of education: Not on file  . Highest education level: Not on file  Occupational History  . Not on file  Tobacco Use  . Smoking status: Former Smoker    Types: Cigarettes    Quit date: 03/19/1993    Years since quitting: 26.5  . Smokeless tobacco: Never Used  Substance and Sexual Activity  . Alcohol use: Yes    Alcohol/week: 6.0 standard drinks    Types: 6 Standard drinks or equivalent per week  . Drug use: No  . Sexual activity: Never    Partners: Male    Birth control/protection: Surgical    Comment: TAH  Other Topics Concern  . Not on file  Social History Narrative  . Not on file   Social Determinants of  Health   Financial Resource Strain:   . Difficulty of Paying Living Expenses: Not on file  Food Insecurity:   . Worried About Programme researcher, broadcasting/film/video in the Last Year: Not on file  . Ran Out of Food in the Last Year: Not on file  Transportation Needs:   . Lack of Transportation (Medical): Not on file  . Lack of Transportation (Non-Medical): Not on file  Physical Activity:   . Days of Exercise per Week: Not on file  . Minutes of Exercise per Session: Not on file  Stress:   . Feeling of Stress : Not on file  Social Connections:   . Frequency of Communication with Friends and Family: Not on file  . Frequency of Social Gatherings with Friends and Family: Not on file  . Attends Religious Services: Not on file  . Active Member of Clubs or Organizations: Not on file  . Attends Banker Meetings: Not on file  . Marital  Status: Not on file     Family History: The patient's family history includes Diabetes in her mother; Heart disease in her father; Hypertension in her mother.  ROS:   Please see the history of present illness.     All other systems reviewed and are negative.  EKGs/Labs/Other Studies Reviewed:    The following studies were reviewed today:    Recent Labs: No results found for requested labs within last 8760 hours.  Recent Lipid Panel No results found for: CHOL, TRIG, HDL, CHOLHDL, VLDL, LDLCALC, LDLDIRECT  Physical Exam:    VS:  BP 120/64   Pulse 81   Ht 5\' 2"  (1.575 m)   Wt 129 lb 12.8 oz (58.9 kg)   LMP 08/28/1974   SpO2 98%   BMI 23.74 kg/m     Wt Readings from Last 3 Encounters:  09/12/19 129 lb 12.8 oz (58.9 kg)  09/25/18 130 lb (59 kg)  08/31/17 133 lb 9.6 oz (60.6 kg)     GEN:  Well nourished, well developed in no acute distress HEENT: Normal NECK: No JVD; No carotid bruits LYMPHATICS: No lymphadenopathy CARDIAC: RRR, no murmurs, rubs, gallops RESPIRATORY:  Clear to auscultation without rales, wheezing or rhonchi  ABDOMEN: Soft, non-tender, non-distended MUSCULOSKELETAL:  No edema; No deformity  SKIN: Warm and dry NEUROLOGIC:  Alert and oriented x 3 PSYCHIATRIC:  Normal affect   EKG:  Jan. 15, 2021:   NSR at 81.  No ST or T wave changes.   ASSESSMENT:    1. Pure hypercholesterolemia   2. Aortic atherosclerosis (HCC)    PLAN:    In order of problems listed above:  1. Aortic atherosclerosis  06-14-1996 has had a long hx of hyperlipidemia.  Her coronary calcium score was 0.  She was incidentally found to have aortic atherosclerosis.  This is a rather benign finding and does not need additional follow up.   I've asked her to continue to keep an eye on her liids .  I do not think she necessarily needs to be started on a statin because her coronary calcium score is 0.  I will be happy to see her on an as needed basis.    Medication Adjustments/Labs and  Tests Ordered: Current medicines are reviewed at length with the patient today.  Concerns regarding medicines are outlined above.  No orders of the defined types were placed in this encounter.  No orders of the defined types were placed in this encounter.   There are no Patient  Instructions on file for this visit.   Signed, Mertie Moores, MD  09/12/2019 2:44 PM    [place]

## 2019-09-12 NOTE — Patient Instructions (Signed)
Medication Instructions:  Your physician recommends that you continue on your current medications as directed. Please refer to the Current Medication list given to you today.  *If you need a refill on your cardiac medications before your next appointment, please call your pharmacy*   Lab Work: None Ordered If you have labs (blood work) drawn today and your tests are completely normal, you will receive your results only by: . MyChart Message (if you have MyChart) OR . A paper copy in the mail If you have any lab test that is abnormal or we need to change your treatment, we will call you to review the results.   Testing/Procedures: None Ordered   Follow-Up: At [facility], you and your health needs are our priority.  As part of our continuing mission to provide you with exceptional heart care, we have created designated Provider Care Teams.  These Care Teams include your primary Cardiologist (physician) and Advanced Practice Providers (APPs -  Physician Assistants and Nurse Practitioners) who all work together to provide you with the care you need, when you need it.   Your next appointment:    As Needed  The format for your next appointment:   Either In Person or Virtual  Provider:   You may see Philip Nahser, MD or one of the following Advanced Practice Providers on your designated Care Team:    Scott Weaver, PA-C  Vin Bhagat, PA-C  Janine Hammond, NP     

## 2019-09-24 ENCOUNTER — Ambulatory Visit: Payer: Medicare PPO

## 2019-10-01 ENCOUNTER — Other Ambulatory Visit: Payer: Self-pay

## 2019-10-01 NOTE — Progress Notes (Signed)
74 y.o. G0P0 Divorced White or Caucasian Not Hispanic or Latino female here for annual exam.  Patient states that things are good. H/O hysterectomy. No bowel or bladder changes.   She had a cardiac CT scan which was good. She had elevated lipids, Cardiologist didn't feel she needed a statin.   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   H/O OAB, no longer taking medication. Mild and tolerable.      Patient's last menstrual period was 08/28/1974.          Sexually active: No.  The current method of family planning is post menopausal status.    Exercising: Yes.    Yoga and walking  Smoker:  no  Health Maintenance: Pap:  09/25/18 negative. 04-30-15 neg, 04-02-14 Neg History of abnormal Pap:  Yes, Hx dysplasia 50 yrs ago MMG:  05/12/19  Density C Bi-rads 1 neg  BMD:   2014 normal  Colonoscopy: 2014 Normal  TDaP: 2020, fall Gardasil: NA    reports that she quit smoking about 26 years ago. Her smoking use included cigarettes. She has never used smokeless tobacco. She reports current alcohol use of about 6.0 standard drinks of alcohol per week. She reports that she does not use drugs. Retired Child psychotherapist, volunteers at [employer] (not during covid).   Past Medical History:  Diagnosis Date  . Aortic atherosclerosis (HCC)   . Arthritis   . Broken arms 2006   broke both arms in a fall  . DDD (degenerative disc disease) 01/2013   lower spine  . Depression   . History of tobacco abuse   . Hyperlipidemia   . 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 Kidney donor   . Overactive bladder   . Scoliosis 01/2013  . Screening for diabetes mellitus   . Single kidney   . 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 Vitamin D deficiency     Past Surgical History:  Procedure Laterality Date  . ABDOMINAL HYSTERECTOMY    . BUNIONECTOMY WITH HAMMERTOE RECONSTRUCTION  07/2005  . TONSILLECTOMY      Current Outpatient Medications  Medication Sig Dispense Refill  . BIOTIN PO Take by mouth daily.     Marland Kitchen CALCIUM & MAGNESIUM CARBONATES PO Take by mouth daily.    . Cholecalciferol (VITAMIN D) 2000 UNITS tablet Take 2,000 Units by mouth daily.    . Coenzyme Q10-Levocarnitine (CO Q-10 PLUS PO) Take by mouth daily.    . Multiple Vitamins-Minerals (PRESERVISION AREDS 2 PO) Take by mouth 2 (two) times daily.    . Nutritional Supplements (ESTROVEN PO) Take by mouth daily.    . Omega-3 Fatty Acids (OMEGA 3 PO) Take by mouth daily.    . valACYclovir (VALTREX) 500 MG tablet Take 1 tablet (500 mg total) by mouth daily. May increase to 1 tablet BID x 3 days as needed 90 tablet 4   No current facility-administered medications for this visit.    Family History  Problem Relation Age of Onset  . Diabetes Mother   . Hypertension Mother   . Heart disease Father     Review of Systems  All other systems reviewed and are negative.   Exam:   LMP 08/28/1974   Weight change: @WEIGHTCHANGE @ Height:      Ht Readings from Last 3 Encounters:  09/12/19 5\' 2"  (1.575 m)  09/25/18 5\' 2"  (1.575 m)  08/31/17 5\' 2"  (1.575 m)    General appearance: alert, cooperative and appears  stated age Head: Normocephalic, without obvious abnormality, atraumatic Neck: no adenopathy, supple, symmetrical, trachea midline and thyroid normal to inspection and palpation Lungs: clear to auscultation bilaterally Cardiovascular: regular rate and rhythm Breasts: normal appearance, no masses or tenderness Abdomen: soft, non-tender; non distended,  no masses,  no organomegaly Extremities: extremities normal, atraumatic, no cyanosis or edema Skin: Skin color, texture, turgor normal. No rashes or lesions Lymph nodes: Cervical, supraclavicular, and axillary nodes normal. No abnormal inguinal nodes palpated Neurologic: Grossly normal   Pelvic: External genitalia:  no lesions              Urethra:  normal appearing urethra with no masses, tenderness or lesions              Bartholins and Skenes: normal                 Vagina:  normal appearing vagina with normal color and discharge, no lesions              Cervix: absent               Bimanual Exam:  Uterus:  uterus absent              Adnexa: no mass, fullness, tenderness               Rectovaginal: Confirms               Anus:  normal sphincter tone, no lesions  Gae Dry chaperoned for the exam.  A:  Well Woman with normal exam  H/O hysterectomy, very distant h/o cervical dysplasia.  P:   Labs with primary  Discussed breast self exam  Discussed calcium and vit D intake  Mammogram UTD  Colonoscopy UTD  DEXA UTD  No pap this year

## 2019-10-02 ENCOUNTER — Encounter: Payer: Self-pay | Admitting: Obstetrics and Gynecology

## 2019-10-02 ENCOUNTER — Ambulatory Visit (INDEPENDENT_AMBULATORY_CARE_PROVIDER_SITE_OTHER): Payer: Medicare PPO | Admitting: Obstetrics and Gynecology

## 2019-10-02 VITALS — BP 118/62 | HR 78 | Temp 98.1°F | Ht 62.0 in | Wt 127.8 lb

## 2019-10-02 DIAGNOSIS — Z01419 Encounter for gynecological examination (general) (routine) without abnormal findings: Secondary | ICD-10-CM

## 2019-10-02 MED ORDER — VALACYCLOVIR HCL 500 MG PO TABS: ORAL_TABLET | ORAL | 1 refills | Status: DC

## 2019-10-02 NOTE — Patient Instructions (Signed)
EXERCISE AND DIET:  We recommended that you start or continue a regular exercise program for good health. Regular exercise means any activity that makes your heart beat faster and makes you sweat.  We recommend exercising at least 30 minutes per day at least 3 days a week, preferably 4 or 5.  We also recommend a diet low in fat and sugar.  Inactivity, poor dietary choices and obesity can cause diabetes, heart attack, stroke, and kidney damage, among others.    ALCOHOL AND SMOKING:  Women should limit their alcohol intake to no more than 7 drinks/beers/glasses of wine (combined, not each!) per week. Moderation of alcohol intake to this level decreases your risk of breast cancer and liver damage. And of course, no recreational drugs are part of a healthy lifestyle.  And absolutely no smoking or even second hand smoke. Most people know smoking can cause heart and lung diseases, but did you know it also contributes to weakening of your bones? Aging of your skin?  Yellowing of your teeth and nails?  CALCIUM AND VITAMIN D:  Adequate intake of calcium and Vitamin D are recommended.  The recommendations for exact amounts of these supplements seem to change often, but generally speaking 1,000 mg of calcium (between diet and supplement) and 800 units of Vitamin D per day seems prudent. Certain women may benefit from higher intake of Vitamin D.  If you are among these women, your doctor will have told you during your visit.    PAP SMEARS:  Pap smears, to check for cervical cancer or precancers,  have traditionally been done yearly, although recent scientific advances have shown that most women can have pap smears less often.  However, every woman still should have a physical exam from her gynecologist every year. It will include a breast check, inspection of the vulva and vagina to check for abnormal growths or skin changes, a visual exam of the cervix, and then an exam to evaluate the size and shape of the uterus and  ovaries.  And after 76 years of age, a rectal exam is indicated to check for rectal cancers. We will also provide age appropriate advice regarding health maintenance, like when you should have certain vaccines, screening for sexually transmitted diseases, bone density testing, colonoscopy, mammograms, etc.   MAMMOGRAMS:  All women over 40 years old should have a yearly mammogram. Many facilities now offer a "3D" mammogram, which may cost around $50 extra out of pocket. If possible,  we recommend you accept the option to have the 3D mammogram performed.  It both reduces the number of women who will be called back for extra views which then turn out to be normal, and it is better than the routine mammogram at detecting truly abnormal areas.    COLON CANCER SCREENING: Now recommend starting at age 45. At this time colonoscopy is not covered for routine screening until 50. There are take home tests that can be done between 45-49.   COLONOSCOPY:  Colonoscopy to screen for colon cancer is recommended for all women at age 50.  We know, you hate the idea of the prep.  We agree, BUT, having colon cancer and not knowing it is worse!!  Colon cancer so often starts as a polyp that can be seen and removed at colonscopy, which can quite literally save your life!  And if your first colonoscopy is normal and you have no family history of colon cancer, most women don't have to have it again for   10 years.  Once every ten years, you can do something that may end up saving your life, right?  We will be happy to help you get it scheduled when you are ready.  Be sure to check your insurance coverage so you understand how much it will cost.  It may be covered as a preventative service at no cost, but you should check your particular policy.      Breast Self-Awareness Breast self-awareness means being familiar with how your breasts look and feel. It involves checking your breasts regularly and reporting any changes to your  health care provider. Practicing breast self-awareness is important. A change in your breasts can be a sign of a serious medical problem. Being familiar with how your breasts look and feel allows you to find any problems early, when treatment is more likely to be successful. All women should practice breast self-awareness, including women who have had breast implants. How to do a breast self-exam One way to learn what is normal for your breasts and whether your breasts are changing is to do a breast self-exam. To do a breast self-exam: Look for Changes  1. Remove all the clothing above your waist. 2. Stand in front of a mirror in a room with good lighting. 3. Put your hands on your hips. 4. Push your hands firmly downward. 5. Compare your breasts in the mirror. Look for differences between them (asymmetry), such as: ? Differences in shape. ? Differences in size. ? Puckers, dips, and bumps in one breast and not the other. 6. Look at each breast for changes in your skin, such as: ? Redness. ? Scaly areas. 7. Look for changes in your nipples, such as: ? Discharge. ? Bleeding. ? Dimpling. ? Redness. ? A change in position. Feel for Changes Carefully feel your breasts for lumps and changes. It is best to do this while lying on your back on the floor and again while sitting or standing in the shower or tub with soapy water on your skin. Feel each breast in the following way:  Place the arm on the side of the breast you are examining above your head.  Feel your breast with the other hand.  Start in the nipple area and make  inch (2 cm) overlapping circles to feel your breast. Use the pads of your three middle fingers to do this. Apply light pressure, then medium pressure, then firm pressure. The light pressure will allow you to feel the tissue closest to the skin. The medium pressure will allow you to feel the tissue that is a little deeper. The firm pressure will allow you to feel the tissue  close to the ribs.  Continue the overlapping circles, moving downward over the breast until you feel your ribs below your breast.  Move one finger-width toward the center of the body. Continue to use the  inch (2 cm) overlapping circles to feel your breast as you move slowly up toward your collarbone.  Continue the up and down exam using all three pressures until you reach your armpit.  Write Down What You Find  Write down what is normal for each breast and any changes that you find. Keep a written record with breast changes or normal findings for each breast. By writing this information down, you do not need to depend only on memory for size, tenderness, or location. Write down where you are in your menstrual cycle, if you are still menstruating. If you are having trouble noticing differences   in your breasts, do not get discouraged. With time you will become more familiar with the variations in your breasts and more comfortable with the exam. How often should I examine my breasts? Examine your breasts every month. If you are breastfeeding, the best time to examine your breasts is after a feeding or after using a breast pump. If you menstruate, the best time to examine your breasts is 5-7 days after your period is over. During your period, your breasts are lumpier, and it may be more difficult to notice changes. When should I see my health care provider? See your health care provider if you notice:  A change in shape or size of your breasts or nipples.  A change in the skin of your breast or nipples, such as a reddened or scaly area.  Unusual discharge from your nipples.  A lump or thick area that was not there before.  Pain in your breasts.  Anything that concerns you.  

## 2019-12-26 DIAGNOSIS — M25511 Pain in right shoulder: Secondary | ICD-10-CM | POA: Insufficient documentation

## 2020-04-23 ENCOUNTER — Other Ambulatory Visit: Payer: Self-pay | Admitting: Obstetrics and Gynecology

## 2020-04-23 DIAGNOSIS — Z1231 Encounter for screening mammogram for malignant neoplasm of breast: Secondary | ICD-10-CM

## 2020-05-12 ENCOUNTER — Ambulatory Visit
Admission: RE | Admit: 2020-05-12 | Discharge: 2020-05-12 | Disposition: A | Discharge status: Home / Self Care | Payer: Medicare PPO | Source: Ambulatory Visit | Attending: Obstetrics and Gynecology | Admitting: Obstetrics and Gynecology

## 2020-05-12 ENCOUNTER — Other Ambulatory Visit: Payer: Self-pay

## 2020-05-12 DIAGNOSIS — Z1231 Encounter for screening mammogram for malignant neoplasm of breast: Secondary | ICD-10-CM

## 2020-09-30 ENCOUNTER — Other Ambulatory Visit: Payer: Self-pay | Admitting: Obstetrics and Gynecology

## 2020-09-30 NOTE — Telephone Encounter (Signed)
Last AEX 10/02/19 with Dr. Oscar La. Scheduled AEX 10/06/20 with Dr. Oscar La.

## 2020-10-06 ENCOUNTER — Ambulatory Visit: Payer: Medicare PPO | Admitting: Obstetrics and Gynecology

## 2020-10-06 ENCOUNTER — Other Ambulatory Visit: Payer: Self-pay

## 2020-10-06 NOTE — Progress Notes (Deleted)
75 y.o. G0P0 Divorced White or Caucasian Not Hispanic or Latino female here for annual exam.      Patient's last menstrual period was 08/28/1974.          Sexually active: {yes no:314532}  The current method of family planning is post menopausal status.    Exercising: {yes no:314532}  {types:19826} Smoker:  no  Health Maintenance: Pap:  09/25/18 Neg  04/30/15 Neg History of abnormal Pap:  Yes, hx of dysplasia years ago MMG:  05/12/20 BIRADS 1 negative/density c BMD:   05/26/16 Normal Colonoscopy: 2014 Normal TDaP:  2020 Gardasil: n/a   reports that she quit smoking about 27 years ago. Her smoking use included cigarettes. She has never used smokeless tobacco. She reports current alcohol use of about 6.0 standard drinks of alcohol per week. She reports that she does not use drugs.  Past Medical History:  Diagnosis Date  . Aortic atherosclerosis (HCC)   . Arthritis   . Broken arms 2006   broke both arms in a fall  . DDD (degenerative disc disease) 01/2013   lower spine  . Depression   . History of tobacco abuse   . Hyperlipidemia   . 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 Kidney donor   . Overactive bladder   . Scoliosis 01/2013  . Screening for diabetes mellitus   . Single kidney   . 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 Vitamin D deficiency     Past Surgical History:  Procedure Laterality Date  . ABDOMINAL HYSTERECTOMY    . BUNIONECTOMY WITH HAMMERTOE RECONSTRUCTION  07/2005  . CARDIAC CATHETERIZATION N/A 08/29/2019  . TONSILLECTOMY      Current Outpatient Medications  Medication Sig Dispense Refill  . BIOTIN PO Take by mouth daily.    Marland Kitchen CALCIUM & MAGNESIUM CARBONATES PO Take by mouth daily.    . Cholecalciferol (VITAMIN D) 2000 UNITS tablet Take 2,000 Units by mouth daily.    . Coenzyme Q10-Levocarnitine (CO Q-10 PLUS PO) Take by mouth daily.    . Multiple Vitamins-Minerals (PRESERVISION AREDS 2 PO) Take by mouth 2 (two) times daily.    . Omega-3 Fatty Acids  (OMEGA 3 PO) Take by mouth daily.    Marland Kitchen OVER THE COUNTER MEDICATION 25 mg. CBD    . valACYclovir (VALTREX) 500 MG tablet TAKE ONE TABLET BY MOUTH TWICE DAILY FOR THREE DAYS AS NEEDED 30 tablet 1   No current facility-administered medications for this visit.    Family History  Problem Relation Age of Onset  . Diabetes Mother   . Hypertension Mother   . Heart disease Father     Review of Systems  Exam:   LMP 08/28/1974   Weight change: @WEIGHTCHANGE @ Height:      Ht Readings from Last 3 Encounters:  10/02/19 5\' 2"  (1.575 m)  09/12/19 5\' 2"  (1.575 m)  09/25/18 5\' 2"  (1.575 m)    General appearance: alert, cooperative and appears stated age Head: Normocephalic, without obvious abnormality, atraumatic Neck: no adenopathy, supple, symmetrical, trachea midline and thyroid {CHL AMB PHY EX THYROID NORM DEFAULT:[phone]::"normal to inspection and palpation"} Lungs: clear to auscultation bilaterally Cardiovascular: regular rate and rhythm Breasts: {Exam; breast:13139::"normal appearance, no masses or tenderness"} Abdomen: soft, non-tender; non distended,  no masses,  no organomegaly Extremities: extremities normal, atraumatic, no cyanosis or edema Skin: Skin color, texture, turgor normal. No rashes or lesions Lymph nodes: Cervical, supraclavicular, and axillary nodes normal. No abnormal inguinal nodes palpated Neurologic: Grossly normal  Pelvic: External genitalia:  no lesions              Urethra:  normal appearing urethra with no masses, tenderness or lesions              Bartholins and Skenes: normal                 Vagina: normal appearing vagina with normal color and discharge, no lesions              Cervix: {CHL AMB PHY EX CERVIX NORM DEFAULT:[id number]::"no lesions"}               Bimanual Exam:  Uterus:  {CHL AMB PHY EX UTERUS NORM DEFAULT:[phone]::"normal size, contour, position, consistency, mobility, non-tender"}              Adnexa: {CHL AMB PHY EX ADNEXA NO MASS  DEFAULT:[phone]::"no mass, fullness, tenderness"}               Rectovaginal: Confirms               Anus:  normal sphincter tone, no lesions  *** chaperoned for the exam.  A:  Well Woman with normal exam  P:

## 2020-11-05 ENCOUNTER — Ambulatory Visit: Payer: Medicare PPO | Admitting: Obstetrics and Gynecology

## 2020-11-17 DIAGNOSIS — Z524 Kidney donor: Secondary | ICD-10-CM | POA: Diagnosis not present

## 2020-11-17 DIAGNOSIS — E559 Vitamin D deficiency, unspecified: Secondary | ICD-10-CM | POA: Diagnosis not present

## 2020-11-17 DIAGNOSIS — E785 Hyperlipidemia, unspecified: Secondary | ICD-10-CM | POA: Diagnosis not present

## 2020-11-17 DIAGNOSIS — Z905 Acquired absence of kidney: Secondary | ICD-10-CM | POA: Diagnosis not present

## 2020-11-23 ENCOUNTER — Other Ambulatory Visit: Payer: Self-pay

## 2020-11-23 ENCOUNTER — Encounter: Payer: Self-pay | Admitting: Obstetrics and Gynecology

## 2020-11-23 ENCOUNTER — Ambulatory Visit: Payer: Medicare PPO | Admitting: Obstetrics and Gynecology

## 2020-11-23 VITALS — BP 134/68 | HR 80 | Ht 61.0 in | Wt 128.0 lb

## 2020-11-23 DIAGNOSIS — Z8619 Personal history of other infectious and parasitic diseases: Secondary | ICD-10-CM

## 2020-11-23 DIAGNOSIS — Z905 Acquired absence of kidney: Secondary | ICD-10-CM | POA: Insufficient documentation

## 2020-11-23 DIAGNOSIS — Z87891 Personal history of nicotine dependence: Secondary | ICD-10-CM | POA: Insufficient documentation

## 2020-11-23 DIAGNOSIS — Z01419 Encounter for gynecological examination (general) (routine) without abnormal findings: Secondary | ICD-10-CM

## 2020-11-23 DIAGNOSIS — M5430 Sciatica, unspecified side: Secondary | ICD-10-CM | POA: Insufficient documentation

## 2020-11-23 DIAGNOSIS — G8929 Other chronic pain: Secondary | ICD-10-CM | POA: Insufficient documentation

## 2020-11-23 DIAGNOSIS — E559 Vitamin D deficiency, unspecified: Secondary | ICD-10-CM | POA: Insufficient documentation

## 2020-11-23 DIAGNOSIS — Z524 Kidney donor: Secondary | ICD-10-CM | POA: Insufficient documentation

## 2020-11-23 DIAGNOSIS — Z131 Encounter for screening for diabetes mellitus: Secondary | ICD-10-CM | POA: Insufficient documentation

## 2020-11-23 MED ORDER — VALACYCLOVIR HCL 500 MG PO TABS: ORAL_TABLET | ORAL | 1 refills | Status: DC

## 2020-11-23 NOTE — Patient Instructions (Signed)

## 2020-11-23 NOTE — Progress Notes (Signed)
75 y.o. G0P0 Divorced White or Caucasian Not Hispanic or Latino female here for breast and pelvic exam. See's Dr. Iven Finn at [facility] for PCP now.   H/O hysterectomy.  H/O HSV, she has several outbreaks in the last year.   H/O OAB, tolerable.  No bowel c/o.   Her her 66 year old great nephew overdosed last July. Devastated the family. This was her nieces only child. Dewayne Hatch is very close with her niece.     Patient's last menstrual period was 08/28/1974.          Sexually active: No.  The current method of family planning is post menopausal status.    Exercising: Yes.    Yoga, walking  Smoker:  no  Health Maintenance: Pap:  09/25/18 negative. 04-30-15 neg, 04-02-14 Neg History of abnormal Pap:  Yes 50 years ago hx dysplasia. MMG:  05/14/20 Bi-rads 1 neg  BMD:   2014 normal  Colonoscopy: 2014 normal TDaP:  2020  Gardasil: NA   reports that she quit smoking about 27 years ago. Her smoking use included cigarettes. She has never used smokeless tobacco. She reports current alcohol use of about 6.0 standard drinks of alcohol per week. She reports that she does not use drugs. 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   Past Medical History:  Diagnosis Date  . Aortic atherosclerosis (HCC)   . Arthritis   . Broken arms 2006   broke both arms in a fall  . DDD (degenerative disc disease) 01/2013   lower spine  . Depression   . History of tobacco abuse   . Hyperlipidemia   . 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 Kidney donor   . Overactive bladder   . Scoliosis 01/2013  . Screening for diabetes mellitus   . Single kidney   . 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 Vitamin D deficiency     Past Surgical History:  Procedure Laterality Date  . ABDOMINAL HYSTERECTOMY    . BUNIONECTOMY WITH HAMMERTOE RECONSTRUCTION  07/2005  . CARDIAC CATHETERIZATION N/A 08/29/2019  . TONSILLECTOMY      Current Outpatient Medications  Medication Sig Dispense Refill  . BIOTIN  PO Take by mouth daily.    Marland Kitchen CALCIUM & MAGNESIUM CARBONATES PO Take by mouth daily.    . Cholecalciferol (VITAMIN D) 2000 UNITS tablet Take 2,000 Units by mouth daily.    . Coenzyme Q10-Levocarnitine (CO Q-10 PLUS PO) Take by mouth daily.    . Multiple Vitamins-Minerals (PRESERVISION AREDS 2 PO) Take by mouth 2 (two) times daily.    . Omega-3 Fatty Acids (OMEGA 3 PO) Take by mouth daily.    Marland Kitchen OVER THE COUNTER MEDICATION 25 mg. CBD    . valACYclovir (VALTREX) 500 MG tablet TAKE ONE TABLET BY MOUTH TWICE DAILY FOR THREE DAYS AS NEEDED 30 tablet 1  . Multiple Vitamins-Minerals (PRESERVISION AREDS 2) CAPS See admin instructions.    . Red Yeast Rice 600 MG CAPS See admin instructions.     No current facility-administered medications for this visit.    Family History  Problem Relation Age of Onset  . Diabetes Mother   . Hypertension Mother   . Heart disease Father     Review of Systems  All other systems reviewed and are negative.   Exam:   BP 134/68   Pulse 80   Ht 5\' 1"  (1.549 m)   Wt 128 lb (58.1 kg)   LMP 08/28/1974  SpO2 98%   BMI 24.19 kg/m   Weight change: @WEIGHTCHANGE @ Height:   Height: 5\' 1"  (154.9 cm)  Ht Readings from Last 3 Encounters:  11/23/20 5\' 1"  (1.549 m)  10/02/19 5\' 2"  (1.575 m)  09/12/19 5\' 2"  (1.575 m)    General appearance: alert, cooperative and appears stated age Head: Normocephalic, without obvious abnormality, atraumatic Neck: no adenopathy, supple, symmetrical, trachea midline and thyroid normal to inspection and palpation Breasts: normal appearance, no masses or tenderness Abdomen: soft, non-tender; non distended,  no masses,  no organomegaly Extremities: extremities normal, atraumatic, no cyanosis or edema Skin: Skin color, texture, turgor normal. No rashes or lesions Lymph nodes: Cervical, supraclavicular, and axillary nodes normal. No abnormal inguinal nodes palpated Neurologic: Grossly normal   Pelvic: External genitalia:  no lesions               Urethra:  normal appearing urethra with no masses, tenderness or lesions              Bartholins and Skenes: normal                 Vagina: normal appearing vagina with normal color and discharge, no lesions              Cervix: absent               Bimanual Exam:  Uterus:  uterus absent              Adnexa: no mass, fullness, tenderness               Rectovaginal: Confirms               Anus:  normal sphincter tone, no lesions  chaperoned for the exam.  1. Gynecologic exam normal Discussed breast self exam Discussed calcium and vit D intake Mammogram, colonoscopy and DEXA are UTD  2. History of herpes genitalis Increased outbreaks in the last year with increase stress - valACYclovir (VALTREX) 500 MG tablet; TAKE ONE TABLET BY MOUTH TWICE DAILY FOR THREE DAYS AS NEEDED  Dispense: 30 tablet; Refill: 1

## 2021-01-01 DIAGNOSIS — R0981 Nasal congestion: Secondary | ICD-10-CM | POA: Diagnosis not present

## 2021-02-01 DIAGNOSIS — H524 Presbyopia: Secondary | ICD-10-CM | POA: Diagnosis not present

## 2021-02-01 DIAGNOSIS — H2513 Age-related nuclear cataract, bilateral: Secondary | ICD-10-CM | POA: Diagnosis not present

## 2021-02-01 DIAGNOSIS — H5203 Hypermetropia, bilateral: Secondary | ICD-10-CM | POA: Diagnosis not present

## 2021-02-01 DIAGNOSIS — H52223 Regular astigmatism, bilateral: Secondary | ICD-10-CM | POA: Diagnosis not present

## 2021-03-17 DIAGNOSIS — D1801 Hemangioma of skin and subcutaneous tissue: Secondary | ICD-10-CM | POA: Diagnosis not present

## 2021-03-17 DIAGNOSIS — L814 Other melanin hyperpigmentation: Secondary | ICD-10-CM | POA: Diagnosis not present

## 2021-03-17 DIAGNOSIS — L821 Other seborrheic keratosis: Secondary | ICD-10-CM | POA: Diagnosis not present

## 2021-03-17 DIAGNOSIS — D224 Melanocytic nevi of scalp and neck: Secondary | ICD-10-CM | POA: Diagnosis not present

## 2021-03-17 DIAGNOSIS — D2261 Melanocytic nevi of right upper limb, including shoulder: Secondary | ICD-10-CM | POA: Diagnosis not present

## 2021-05-30 ENCOUNTER — Other Ambulatory Visit: Payer: Self-pay | Admitting: Obstetrics and Gynecology

## 2021-05-30 DIAGNOSIS — Z1231 Encounter for screening mammogram for malignant neoplasm of breast: Secondary | ICD-10-CM

## 2021-06-01 ENCOUNTER — Ambulatory Visit
Admission: RE | Admit: 2021-06-01 | Discharge: 2021-06-01 | Disposition: A | Discharge status: Home / Self Care | Payer: Medicare PPO | Source: Ambulatory Visit | Attending: Obstetrics and Gynecology | Admitting: Obstetrics and Gynecology

## 2021-06-01 ENCOUNTER — Other Ambulatory Visit: Payer: Self-pay

## 2021-06-01 DIAGNOSIS — Z1231 Encounter for screening mammogram for malignant neoplasm of breast: Secondary | ICD-10-CM | POA: Diagnosis not present

## 2021-06-22 DIAGNOSIS — E785 Hyperlipidemia, unspecified: Secondary | ICD-10-CM | POA: Diagnosis not present

## 2021-06-22 DIAGNOSIS — E559 Vitamin D deficiency, unspecified: Secondary | ICD-10-CM | POA: Diagnosis not present

## 2021-06-29 DIAGNOSIS — Z0000 Encounter for general adult medical examination without abnormal findings: Secondary | ICD-10-CM | POA: Diagnosis not present

## 2021-06-29 DIAGNOSIS — Z524 Kidney donor: Secondary | ICD-10-CM | POA: Diagnosis not present

## 2021-06-29 DIAGNOSIS — E785 Hyperlipidemia, unspecified: Secondary | ICD-10-CM | POA: Diagnosis not present

## 2021-06-29 DIAGNOSIS — Z1212 Encounter for screening for malignant neoplasm of rectum: Secondary | ICD-10-CM | POA: Diagnosis not present

## 2021-06-29 DIAGNOSIS — E559 Vitamin D deficiency, unspecified: Secondary | ICD-10-CM | POA: Diagnosis not present

## 2021-06-29 DIAGNOSIS — R82998 Other abnormal findings in urine: Secondary | ICD-10-CM | POA: Diagnosis not present

## 2021-06-29 DIAGNOSIS — Z905 Acquired absence of kidney: Secondary | ICD-10-CM | POA: Diagnosis not present

## 2021-07-22 ENCOUNTER — Emergency Department (HOSPITAL_BASED_OUTPATIENT_CLINIC_OR_DEPARTMENT_OTHER): Payer: Medicare PPO | Admitting: Radiology

## 2021-07-22 ENCOUNTER — Encounter (HOSPITAL_BASED_OUTPATIENT_CLINIC_OR_DEPARTMENT_OTHER): Payer: Self-pay | Admitting: *Deleted

## 2021-07-22 ENCOUNTER — Emergency Department (HOSPITAL_BASED_OUTPATIENT_CLINIC_OR_DEPARTMENT_OTHER)
Admission: EM | Admit: 2021-07-22 | Discharge: 2021-07-22 | Disposition: A | Discharge status: Home / Self Care | Payer: Medicare PPO | Attending: Emergency Medicine | Admitting: Emergency Medicine

## 2021-07-22 ENCOUNTER — Other Ambulatory Visit: Payer: Self-pay

## 2021-07-22 DIAGNOSIS — Z87891 Personal history of nicotine dependence: Secondary | ICD-10-CM | POA: Diagnosis not present

## 2021-07-22 DIAGNOSIS — M79671 Pain in right foot: Secondary | ICD-10-CM | POA: Insufficient documentation

## 2021-07-22 DIAGNOSIS — X501XXA Overexertion from prolonged static or awkward postures, initial encounter: Secondary | ICD-10-CM | POA: Diagnosis not present

## 2021-07-22 DIAGNOSIS — S92354A Nondisplaced fracture of fifth metatarsal bone, right foot, initial encounter for closed fracture: Secondary | ICD-10-CM | POA: Diagnosis not present

## 2021-07-22 DIAGNOSIS — R52 Pain, unspecified: Secondary | ICD-10-CM

## 2021-07-22 DIAGNOSIS — Z043 Encounter for examination and observation following other accident: Secondary | ICD-10-CM | POA: Diagnosis not present

## 2021-07-22 DIAGNOSIS — Y92480 Sidewalk as the place of occurrence of the external cause: Secondary | ICD-10-CM | POA: Insufficient documentation

## 2021-07-22 DIAGNOSIS — M7989 Other specified soft tissue disorders: Secondary | ICD-10-CM | POA: Diagnosis not present

## 2021-07-22 DIAGNOSIS — S99921A Unspecified injury of right foot, initial encounter: Secondary | ICD-10-CM | POA: Diagnosis present

## 2021-07-22 NOTE — ED Provider Notes (Signed)
[facility] EMERGENCY DEPT Provider Note   CSN: [id number] Arrival date & time: 07/22/21  1203     History Chief Complaint  Patient presents with   Ankle Pain    Kristen Adams is a 75 y.o. female.  Patient presents today for evaluation of cute onset of right foot pain starting yesterday.  Patient was stepping on uneven pavement when she rolled over on her right foot.  She has had pain since that time.  Worse with bearing weight, but she has been able to get around.  She has been taking over-the-counter medications.  Pain was still persistent this morning, prompting emergency department visit.  No knee or hip pain.  She did not hit her head.  Course is constant.      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/23/2020   Chronic pain 11/23/2020   Diabetes mellitus screening 11/23/2020   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   History of tobacco use 11/23/2020   Sciatica 11/23/2020   Vitamin D deficiency 11/23/2020   Shoulder pain, right 12/26/2019   Hyperlipidemia 09/12/2019   Aortic atherosclerosis (HCC) 09/12/2019   Degenerative scoliosis 08/26/2019   Pain in right knee 09/09/2018   Overactive bladder     Past Surgical History:  Procedure Laterality Date   ABDOMINAL HYSTERECTOMY     BUNIONECTOMY WITH HAMMERTOE RECONSTRUCTION  07/2005   CARDIAC CATHETERIZATION N/A 08/29/2019   TONSILLECTOMY       OB History     Gravida  0   Para      Term       Preterm      AB      Living         SAB      IAB      Ectopic      Multiple      Live Births              Family History  Problem Relation Age of Onset   Diabetes Mother    Hypertension Mother    Heart disease Father     Social History   Tobacco Use   Smoking status: Former    Types: Cigarettes    Quit date: 03/19/1993    Years since quitting: 28.3   Smokeless tobacco: Never  Vaping Use   Vaping Use: Never used  Substance Use Topics   Alcohol use: Yes    Alcohol/week: 6.0 standard drinks    Types: 6 Standard drinks or equivalent per week   Drug use: No    Home Medications Prior to Admission medications   Medication Sig Start Date End Date Taking? Authorizing Provider  BIOTIN PO Take by mouth daily.   Yes [provider]  CALCIUM & MAGNESIUM CARBONATES PO Take by mouth daily.   Yes [provider]  Cholecalciferol (  VITAMIN D) 2000 UNITS tablet Take 2,000 Units by mouth daily.   Yes [provider]  Coenzyme Q10-Levocarnitine (CO Q-10 PLUS PO) Take by mouth daily.   Yes [provider]  Multiple Vitamins-Minerals (PRESERVISION AREDS 2 PO) Take by mouth 2 (two) times daily.   Yes [provider]  Multiple Vitamins-Minerals (PRESERVISION AREDS 2) CAPS See admin instructions.   Yes [provider]  Omega-3 Fatty Acids (OMEGA 3 PO) Take by mouth daily.   Yes [provider]  OVER THE COUNTER MEDICATION 25 mg. CBD   Yes [provider]  Red Yeast Rice 600 MG CAPS See admin instructions.   Yes [provider]  valACYclovir (VALTREX) 500 MG tablet TAKE ONE TABLET BY MOUTH TWICE DAILY FOR THREE DAYS AS NEEDED 11/23/20   Romualdo Bolk, MD    Allergies    Shellfish allergy  Review of Systems   Review of Systems  Constitutional:  Negative for activity change.  Musculoskeletal:  Positive for arthralgias, gait problem and joint swelling. Negative for back pain and neck pain.   Skin:  Negative for wound.  Neurological:  Negative for weakness and numbness.   Physical Exam Updated Vital Signs BP 136/67 (BP Location: Right Arm)   Pulse 73   Temp 98.7 F (37.1 C)   Resp 15   Ht 5\' 2"  (1.575 m)   Wt 58.1 kg   LMP 08/28/1974   SpO2 100%   BMI 23.41 kg/m   Physical Exam Vitals and nursing note reviewed.  Constitutional:      Appearance: She is well-developed.  HENT:     Head: Normocephalic and atraumatic.  Eyes:     Pupils: Pupils are equal, round, and reactive to light.  Cardiovascular:     Pulses: Normal pulses. No decreased pulses.  Musculoskeletal:        General: Tenderness present.     Cervical back: Normal range of motion and neck supple.     Right ankle: No tenderness. Normal range of motion.     Right foot: Decreased range of motion. Bony tenderness present.     Comments: Patient is exquisitely tender to palpation over the proximal right fifth metatarsal bone.  There is surrounding swelling extending over the forefoot.  Skin:    General: Skin is warm and dry.  Neurological:     Mental Status: She is alert.     Sensory: No sensory deficit.     Comments: Motor, sensation, and vascular distal to the injury is fully intact.   Psychiatric:        Mood and Affect: Mood normal.    ED Results / Procedures / Treatments   Labs (all labs ordered are listed, but only abnormal results are displayed) Labs Reviewed - No data to display  EKG None  Radiology DG Ankle Complete Right  Result Date: 07/22/2021 CLINICAL DATA:  75 year old female status post fall. EXAM: RIGHT ANKLE - COMPLETE 3+ VIEW COMPARISON:  None. FINDINGS: There is no evidence of fracture, dislocation, or joint effusion. There is no evidence of arthropathy or other focal bone abnormality. Postsurgical changes after first tarsometatarsal joint arthrodesis with 2 lag screws without complicating features. Soft tissues are unremarkable. IMPRESSION: No acute fracture or malalignment.  Electronically Signed   By: 60 M.D.   On: 07/22/2021 14:57   DG Foot Complete Right  Result Date: 07/22/2021 CLINICAL DATA:  Right foot/ankle injury yesterday. EXAM: RIGHT FOOT COMPLETE - 3+ VIEW COMPARISON:  None. FINDINGS: Nondisplaced, non  comminuted fracture at the base of the fifth metatarsal. No other fractures. Previous ORIF and fusion at the medial cuneiform first metatarsal articulation. Mild asymmetric joint space narrowing with small marginal osteophytes at the first metatarsophalangeal joint consistent with osteoarthritis. Remaining joints normally spaced and aligned. Mild lateral soft midfoot soft tissue swelling. IMPRESSION: 1. Nondisplaced, non comminuted fracture at the base of the right fifth metatarsal. Electronically Signed   By: Amie Portland M.D.   On: 07/22/2021 14:55    Procedures Procedures   Medications Ordered in ED Medications - No data to display  ED Course  I have reviewed the triage vital signs and the nursing notes.  Pertinent labs & imaging results that were available during my care of the patient were reviewed by me and considered in my medical decision making (see chart for details).  Patient seen and examined.  X-rays reviewed.  Patient updated on results.  She will need posterior splint and crutches.  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  Encourage patient to follow-up as outpatient.  Otherwise RICE protocol, OTC meds indicated.  Vital signs reviewed and are as follows: BP 136/67 (BP Location: Right Arm)   Pulse 73   Temp 98.7 F (37.1 C)   Resp 15   Ht 5\' 2"  (1.575 m)   Wt 58.1 kg   LMP 08/28/1974   SpO2 100%   BMI 23.41 kg/m   Splint placed.  Personally checked prior to discharge.  Patient with distal CMS intact.    MDM Rules/Calculators/A&P                           Patient with proximal fifth metatarsal fracture.  Foot is neurovascularly intact.  Treatment plan as above.  No concern for knee or hip  injury.   Final Clinical Impression(s) / ED Diagnoses Final diagnoses:  Closed nondisplaced fracture of fifth metatarsal bone of right foot, initial encounter    Rx / DC Orders ED Discharge Orders     None        10/27/1974, PA-C 07/22/21 1843    07/24/21, DO 07/22/21 2356

## 2021-07-22 NOTE — ED Triage Notes (Signed)
Pt stepped on uneven pavement yesterday injuring her rt ankle. Pt  able to place weight with pain.

## 2021-07-22 NOTE — Discharge Instructions (Signed)
Please read and follow all provided instructions.  Your diagnoses today include:  1. Closed nondisplaced fracture of fifth metatarsal bone of right foot, initial encounter   2. Pain     Tests performed today include: An x-ray of the affected area -shows a broken bone in your foot Vital signs. See below for your results today.   Medications prescribed:  None   Take any prescribed medications only as directed.  Home care instructions:  Follow any educational materials contained in this packet Follow R.I.C.E. Protocol: R - rest your injury  I  - use ice on injury without applying directly to skin C - compress injury with bandage or splint E - elevate the injury as much as possible  Follow-up instructions: Call your orthopedic physician on Monday for an appointment.  Return instructions:  Please return if your toes or feet are numb or tingling, appear gray or blue, or you have severe pain (also elevate the leg and loosen splint or wrap if you were given one) Please return to the Emergency Department if you experience worsening symptoms.  Please return if you have any other emergent concerns.  Additional Information:  Your vital signs today were: BP 136/67 (BP Location: Right Arm)   Pulse 73   Temp 98.7 F (37.1 C)   Resp 15   Ht 5\' 2"  (1.575 m)   Wt 58.1 kg   LMP 08/28/1974   SpO2 100%   BMI 23.41 kg/m  If your blood pressure (BP) was elevated above 135/85 this visit, please have this repeated by your doctor within one month. --------------

## 2021-07-25 DIAGNOSIS — M79671 Pain in right foot: Secondary | ICD-10-CM | POA: Diagnosis not present

## 2021-07-25 DIAGNOSIS — S92354A Nondisplaced fracture of fifth metatarsal bone, right foot, initial encounter for closed fracture: Secondary | ICD-10-CM | POA: Diagnosis not present

## 2021-07-25 DIAGNOSIS — S92353A Displaced fracture of fifth metatarsal bone, unspecified foot, initial encounter for closed fracture: Secondary | ICD-10-CM | POA: Insufficient documentation

## 2021-08-24 DIAGNOSIS — S92354A Nondisplaced fracture of fifth metatarsal bone, right foot, initial encounter for closed fracture: Secondary | ICD-10-CM | POA: Diagnosis not present

## 2021-08-24 DIAGNOSIS — S92354D Nondisplaced fracture of fifth metatarsal bone, right foot, subsequent encounter for fracture with routine healing: Secondary | ICD-10-CM | POA: Diagnosis not present

## 2022-01-05 DIAGNOSIS — M25551 Pain in right hip: Secondary | ICD-10-CM | POA: Diagnosis not present

## 2022-01-05 DIAGNOSIS — M9903 Segmental and somatic dysfunction of lumbar region: Secondary | ICD-10-CM | POA: Diagnosis not present

## 2022-01-05 DIAGNOSIS — M5116 Intervertebral disc disorders with radiculopathy, lumbar region: Secondary | ICD-10-CM | POA: Diagnosis not present

## 2022-01-05 DIAGNOSIS — M9905 Segmental and somatic dysfunction of pelvic region: Secondary | ICD-10-CM | POA: Diagnosis not present

## 2022-01-10 DIAGNOSIS — M9905 Segmental and somatic dysfunction of pelvic region: Secondary | ICD-10-CM | POA: Diagnosis not present

## 2022-01-10 DIAGNOSIS — M9903 Segmental and somatic dysfunction of lumbar region: Secondary | ICD-10-CM | POA: Diagnosis not present

## 2022-01-10 DIAGNOSIS — M5116 Intervertebral disc disorders with radiculopathy, lumbar region: Secondary | ICD-10-CM | POA: Diagnosis not present

## 2022-01-10 DIAGNOSIS — M25551 Pain in right hip: Secondary | ICD-10-CM | POA: Diagnosis not present

## 2022-01-17 DIAGNOSIS — M9903 Segmental and somatic dysfunction of lumbar region: Secondary | ICD-10-CM | POA: Diagnosis not present

## 2022-01-17 DIAGNOSIS — M25551 Pain in right hip: Secondary | ICD-10-CM | POA: Diagnosis not present

## 2022-01-17 DIAGNOSIS — M5116 Intervertebral disc disorders with radiculopathy, lumbar region: Secondary | ICD-10-CM | POA: Diagnosis not present

## 2022-01-17 DIAGNOSIS — M9905 Segmental and somatic dysfunction of pelvic region: Secondary | ICD-10-CM | POA: Diagnosis not present

## 2022-01-24 DIAGNOSIS — M25551 Pain in right hip: Secondary | ICD-10-CM | POA: Diagnosis not present

## 2022-01-24 DIAGNOSIS — M9903 Segmental and somatic dysfunction of lumbar region: Secondary | ICD-10-CM | POA: Diagnosis not present

## 2022-01-24 DIAGNOSIS — M9905 Segmental and somatic dysfunction of pelvic region: Secondary | ICD-10-CM | POA: Diagnosis not present

## 2022-01-24 DIAGNOSIS — M5116 Intervertebral disc disorders with radiculopathy, lumbar region: Secondary | ICD-10-CM | POA: Diagnosis not present

## 2022-01-26 NOTE — Progress Notes (Signed)
76 y.o. G0P0 Divorced White or Caucasian Not Hispanic or Latino female here for breast and pelvic exam.      H/O hysterectomy.   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    H/O OAB, stable.  No bowel c/o.   Her 78 niece, whom she is very close to, was just diagnosed with pancreatic cancer. This niece lost her only child in 7/21.  She is under so much stress.   Patient's last menstrual period was 08/28/1974.          Sexually active: No.  The current method of family planning is post menopausal status.    Exercising: Yes.     Walking and Yoga  Smoker:  no  Health Maintenance: Pap:   09/25/18 negative. 04-30-15 neg, 04-02-14 Neg History of abnormal Pap:  Yes 50 years ago hx dysplasia. MMG:  06/05/21 Bi-rads 1 neg  BMD:   05/26/16 normal  Colonoscopy: : 2014 Normal, was told no more colonoscopies.   TDaP:  UTD with primary Gardasil: n/a   reports that she quit smoking about 28 years ago. Her smoking use included cigarettes. She has never used smokeless tobacco. She reports current alcohol use of about 6.0 standard drinks per week. She reports that she does not use drugs. 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     CALCIUM & MAGNESIUM CARBONATES PO Take by mouth daily.     Cholecalciferol (VITAMIN D) 2000 UNITS tablet Take 2,000 Units by mouth daily.     Coenzyme Q10-Levocarnitine (CO Q-10 PLUS PO) Take by mouth daily.     Multiple Vitamins-Minerals (PRESERVISION AREDS 2 PO) Take by mouth 2 (two) times daily.     Multiple Vitamins-Minerals (PRESERVISION AREDS 2) CAPS See admin instructions.     Omega-3 Fatty Acids (OMEGA 3 PO) Take by mouth daily.     OVER THE COUNTER MEDICATION 25 mg. CBD     Red Yeast Rice 600 MG CAPS See admin instructions.     valACYclovir (VALTREX) 500 MG tablet TAKE ONE TABLET BY MOUTH TWICE DAILY FOR THREE DAYS AS NEEDED 30 tablet 1   No current facility-administered medications for this visit.    Family History  Problem Relation Age of Onset   Diabetes Mother    Hypertension Mother    Heart disease Father     Review of Systems  All other systems reviewed and are negative.   Exam:   LMP 08/28/1974   Weight change: @WEIGHTCHANGE @ Height:      Ht Readings from Last 3  Encounters:  07/22/21 5\' 2"  (1.575 m)  11/23/20 5\' 1"  (1.549 m)  10/02/19 5\' 2"  (1.575 m)    General appearance: alert, cooperative and appears stated age Head: Normocephalic, without obvious abnormality, atraumatic Neck: no adenopathy, supple, symmetrical, trachea midline and thyroid normal to inspection and palpation Breasts: normal appearance, no masses or tenderness Abdomen: soft, non-tender; non distended,  no masses,  no organomegaly Extremities: extremities normal, atraumatic, no cyanosis or edema Skin: Skin color, texture, turgor normal. No rashes or lesions Lymph nodes: Cervical, supraclavicular, and axillary nodes normal. No abnormal inguinal nodes palpated Neurologic: Grossly normal   Pelvic: External genitalia:  no lesions              Urethra:  normal appearing urethra with no masses, tenderness or lesions              Bartholins and Skenes: normal                 Vagina:  atrophic appearing vagina with normal color and discharge, no lesions              Cervix: absent               Bimanual Exam:  Uterus:  uterus absent              Adnexa: no mass, fullness, tenderness               Rectovaginal: Confirms               Anus:  normal sphincter tone, no lesions  chaperoned for the exam.  1. Encounter for breast and pelvic examination Discussed breast self exam Discussed calcium and vit D intake Mammogram UTD No more colonoscopies DEXA UTD Labs with primary. She reports normal renal function  2. Screening for vaginal cancer - Cytology - PAP( [place])  3. History of herpes genitalis - valACYclovir (VALTREX) 500 MG tablet; TAKE ONE TABLET BY MOUTH TWICE DAILY FOR THREE DAYS AS NEEDED  Dispense: 30 tablet; Refill: 1  4. Stress Discussed counseling Offered lexapro, she declines Will talk with her primary about ativan

## 2022-02-02 ENCOUNTER — Ambulatory Visit (INDEPENDENT_AMBULATORY_CARE_PROVIDER_SITE_OTHER): Payer: Medicare PPO | Admitting: Obstetrics and Gynecology

## 2022-02-02 ENCOUNTER — Encounter: Payer: Self-pay | Admitting: Obstetrics and Gynecology

## 2022-02-02 ENCOUNTER — Other Ambulatory Visit (HOSPITAL_COMMUNITY)
Admission: RE | Admit: 2022-02-02 | Discharge: 2022-02-02 | Disposition: A | Discharge status: Home / Self Care | Payer: Medicare PPO | Source: Ambulatory Visit | Attending: Obstetrics and Gynecology | Admitting: Obstetrics and Gynecology

## 2022-02-02 VITALS — BP 130/62 | HR 76 | Ht 61.0 in | Wt 126.0 lb

## 2022-02-02 DIAGNOSIS — F439 Reaction to severe stress, unspecified: Secondary | ICD-10-CM

## 2022-02-02 DIAGNOSIS — Z1272 Encounter for screening for malignant neoplasm of vagina: Secondary | ICD-10-CM | POA: Diagnosis not present

## 2022-02-02 DIAGNOSIS — Z8619 Personal history of other infectious and parasitic diseases: Secondary | ICD-10-CM | POA: Diagnosis not present

## 2022-02-02 DIAGNOSIS — B009 Herpesviral infection, unspecified: Secondary | ICD-10-CM

## 2022-02-02 DIAGNOSIS — Z9189 Other specified personal risk factors, not elsewhere classified: Secondary | ICD-10-CM | POA: Diagnosis not present

## 2022-02-02 DIAGNOSIS — Z01419 Encounter for gynecological examination (general) (routine) without abnormal findings: Secondary | ICD-10-CM

## 2022-02-02 MED ORDER — VALACYCLOVIR HCL 500 MG PO TABS: ORAL_TABLET | ORAL | 1 refills | Status: DC

## 2022-02-03 LAB — CYTOLOGY - PAP: Diagnosis: NEGATIVE

## 2022-03-02 DIAGNOSIS — F4329 Adjustment disorder with other symptoms: Secondary | ICD-10-CM | POA: Diagnosis not present

## 2022-03-02 DIAGNOSIS — F419 Anxiety disorder, unspecified: Secondary | ICD-10-CM | POA: Diagnosis not present

## 2022-03-15 DIAGNOSIS — H1033 Unspecified acute conjunctivitis, bilateral: Secondary | ICD-10-CM | POA: Diagnosis not present

## 2022-03-15 DIAGNOSIS — H5203 Hypermetropia, bilateral: Secondary | ICD-10-CM | POA: Diagnosis not present

## 2022-03-15 DIAGNOSIS — H524 Presbyopia: Secondary | ICD-10-CM | POA: Diagnosis not present

## 2022-03-15 DIAGNOSIS — H52223 Regular astigmatism, bilateral: Secondary | ICD-10-CM | POA: Diagnosis not present

## 2022-04-03 DIAGNOSIS — D1801 Hemangioma of skin and subcutaneous tissue: Secondary | ICD-10-CM | POA: Diagnosis not present

## 2022-04-03 DIAGNOSIS — D2261 Melanocytic nevi of right upper limb, including shoulder: Secondary | ICD-10-CM | POA: Diagnosis not present

## 2022-04-03 DIAGNOSIS — L570 Actinic keratosis: Secondary | ICD-10-CM | POA: Diagnosis not present

## 2022-04-03 DIAGNOSIS — L821 Other seborrheic keratosis: Secondary | ICD-10-CM | POA: Diagnosis not present

## 2022-04-03 DIAGNOSIS — L814 Other melanin hyperpigmentation: Secondary | ICD-10-CM | POA: Diagnosis not present

## 2022-05-10 DIAGNOSIS — D485 Neoplasm of uncertain behavior of skin: Secondary | ICD-10-CM | POA: Diagnosis not present

## 2022-05-10 DIAGNOSIS — L080 Pyoderma: Secondary | ICD-10-CM | POA: Diagnosis not present

## 2022-05-18 ENCOUNTER — Other Ambulatory Visit: Payer: Self-pay | Admitting: Obstetrics and Gynecology

## 2022-05-18 ENCOUNTER — Other Ambulatory Visit (HOSPITAL_BASED_OUTPATIENT_CLINIC_OR_DEPARTMENT_OTHER): Payer: Self-pay

## 2022-05-18 DIAGNOSIS — Z1231 Encounter for screening mammogram for malignant neoplasm of breast: Secondary | ICD-10-CM

## 2022-05-18 MED ORDER — INFLUENZA VAC A&B SA ADJ QUAD 0.5 ML IM PRSY
PREFILLED_SYRINGE | INTRAMUSCULAR | 0 refills | Status: AC
  Filled 2022-05-18: qty 0.5, 1d supply, fill #0

## 2022-06-16 ENCOUNTER — Ambulatory Visit: Payer: Medicare PPO

## 2022-06-20 ENCOUNTER — Ambulatory Visit: Payer: Self-pay

## 2022-06-20 NOTE — Patient Outreach (Signed)
  Care Coordination   Initial Visit Note   06/20/2022 Name: Kristen Adams MRN: [id number] DOB: January 30, 1946  Kristen Adams is a 76 y.o. year old female who sees Avva, Ravisankar, MD for primary care. I spoke with  [name] by phone today.  What matters to the patients health and wellness today?  No concerns, doing well at this time    Goals Addressed             This Visit's Progress    COMPLETED: Care Coordination Activities       Care Coordination Interventions: SDoH screening performed - no acute resource challenges identified at this time Determined the patient does not have concerns with medication costs and/or adherence at this time Education provided on the role of the care coordination team - no follow up desired at this time Instructed the patient to contact her primary care provider as needed         SDOH assessments and interventions completed:  Yes  SDOH Interventions Today    Flowsheet Row Most Recent Value  SDOH Interventions   Food Insecurity Interventions Intervention Not Indicated  Housing Interventions Intervention Not Indicated  Transportation Interventions Intervention Not Indicated  Utilities Interventions Intervention Not Indicated        Care Coordination Interventions Activated:  Yes  Care Coordination Interventions:  Yes, provided   Follow up plan: No further intervention required.   Encounter Outcome:  Pt. Visit Completed   Daneen Schick, BSW, CDP Social Worker, Certified Dementia Practitioner [place] [facility]  Care Coordination [phone]

## 2022-06-20 NOTE — Patient Instructions (Signed)
Visit Information  Thank you for taking time to visit with me today. Please don't hesitate to contact me if I can be of assistance to you.   Following are the goals we discussed today:   Goals Addressed             This Visit's Progress    COMPLETED: Care Coordination Activities       Care Coordination Interventions: SDoH screening performed - no acute resource challenges identified at this time Determined the patient does not have concerns with medication costs and/or adherence at this time Education provided on the role of the care coordination team - no follow up desired at this time Instructed the patient to contact her primary care provider as needed         If you are experiencing a Mental Health or Behavioral Health Crisis or need someone to talk to, please call 1-800-273-TALK (toll free, 24 hour hotline)  Patient verbalizes understanding of instructions and care plan provided today and agrees to view in MyChart. Active MyChart status and patient understanding of how to access instructions and care plan via MyChart confirmed with patient.     No further follow up required: Please contact your primary care provider as needed  Starlina Lapre, BSW, CDP Social Worker, Certified Dementia Practitioner [facility]  Care Coordination [phone]          

## 2022-07-06 ENCOUNTER — Other Ambulatory Visit (HOSPITAL_BASED_OUTPATIENT_CLINIC_OR_DEPARTMENT_OTHER): Payer: Self-pay

## 2022-07-10 DIAGNOSIS — F419 Anxiety disorder, unspecified: Secondary | ICD-10-CM | POA: Diagnosis not present

## 2022-07-10 DIAGNOSIS — R7989 Other specified abnormal findings of blood chemistry: Secondary | ICD-10-CM | POA: Diagnosis not present

## 2022-07-10 DIAGNOSIS — Z780 Asymptomatic menopausal state: Secondary | ICD-10-CM | POA: Diagnosis not present

## 2022-07-10 DIAGNOSIS — E785 Hyperlipidemia, unspecified: Secondary | ICD-10-CM | POA: Diagnosis not present

## 2022-07-10 DIAGNOSIS — M85852 Other specified disorders of bone density and structure, left thigh: Secondary | ICD-10-CM | POA: Diagnosis not present

## 2022-07-10 DIAGNOSIS — E559 Vitamin D deficiency, unspecified: Secondary | ICD-10-CM | POA: Diagnosis not present

## 2022-07-10 DIAGNOSIS — M85851 Other specified disorders of bone density and structure, right thigh: Secondary | ICD-10-CM | POA: Diagnosis not present

## 2022-07-12 DIAGNOSIS — Z1212 Encounter for screening for malignant neoplasm of rectum: Secondary | ICD-10-CM | POA: Diagnosis not present

## 2022-07-17 DIAGNOSIS — F4329 Adjustment disorder with other symptoms: Secondary | ICD-10-CM | POA: Diagnosis not present

## 2022-07-17 DIAGNOSIS — D72819 Decreased white blood cell count, unspecified: Secondary | ICD-10-CM | POA: Diagnosis not present

## 2022-07-17 DIAGNOSIS — Z905 Acquired absence of kidney: Secondary | ICD-10-CM | POA: Diagnosis not present

## 2022-07-17 DIAGNOSIS — F419 Anxiety disorder, unspecified: Secondary | ICD-10-CM | POA: Diagnosis not present

## 2022-07-17 DIAGNOSIS — Z0000 Encounter for general adult medical examination without abnormal findings: Secondary | ICD-10-CM | POA: Diagnosis not present

## 2022-07-17 DIAGNOSIS — R82998 Other abnormal findings in urine: Secondary | ICD-10-CM | POA: Diagnosis not present

## 2022-07-17 DIAGNOSIS — M85852 Other specified disorders of bone density and structure, left thigh: Secondary | ICD-10-CM | POA: Diagnosis not present

## 2022-07-17 DIAGNOSIS — E559 Vitamin D deficiency, unspecified: Secondary | ICD-10-CM | POA: Diagnosis not present

## 2022-07-17 DIAGNOSIS — E785 Hyperlipidemia, unspecified: Secondary | ICD-10-CM | POA: Diagnosis not present

## 2022-07-17 DIAGNOSIS — M85851 Other specified disorders of bone density and structure, right thigh: Secondary | ICD-10-CM | POA: Diagnosis not present

## 2022-07-17 DIAGNOSIS — Z23 Encounter for immunization: Secondary | ICD-10-CM | POA: Diagnosis not present

## 2022-07-25 IMAGING — MG MM DIGITAL SCREENING BILAT W/ TOMO AND CAD
8 series · 9 of 24 positions shown · non-contrast
Comparison: Previous exam(s).

CLINICAL DATA: Screening.

EXAM:
DIGITAL SCREENING BILATERAL MAMMOGRAM WITH TOMOSYNTHESIS AND CAD
TECHNIQUE: Bilateral screening digital craniocaudal and mediolateral oblique
mammograms were obtained. Bilateral screening digital breast
tomosynthesis was performed. The images were evaluated with
computer-aided detection.

[R CC synth-2D]
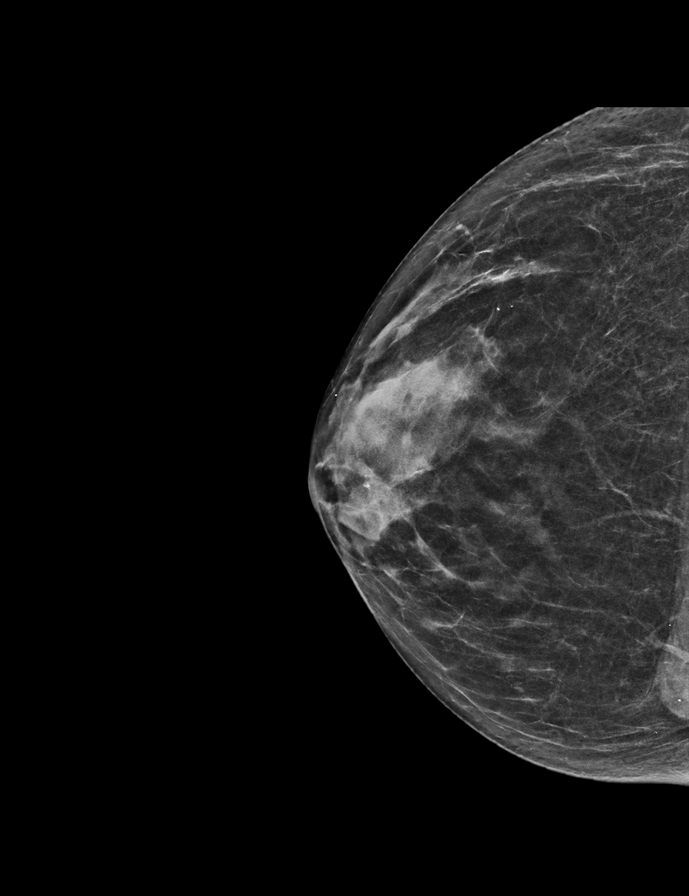

[R MLO synth-2D]
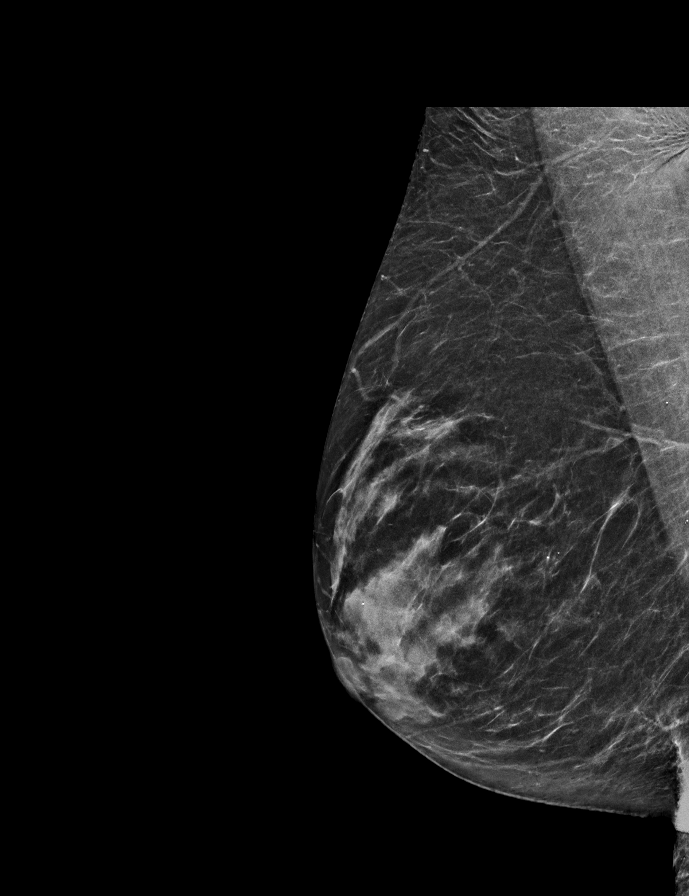

[L CC synth-2D]
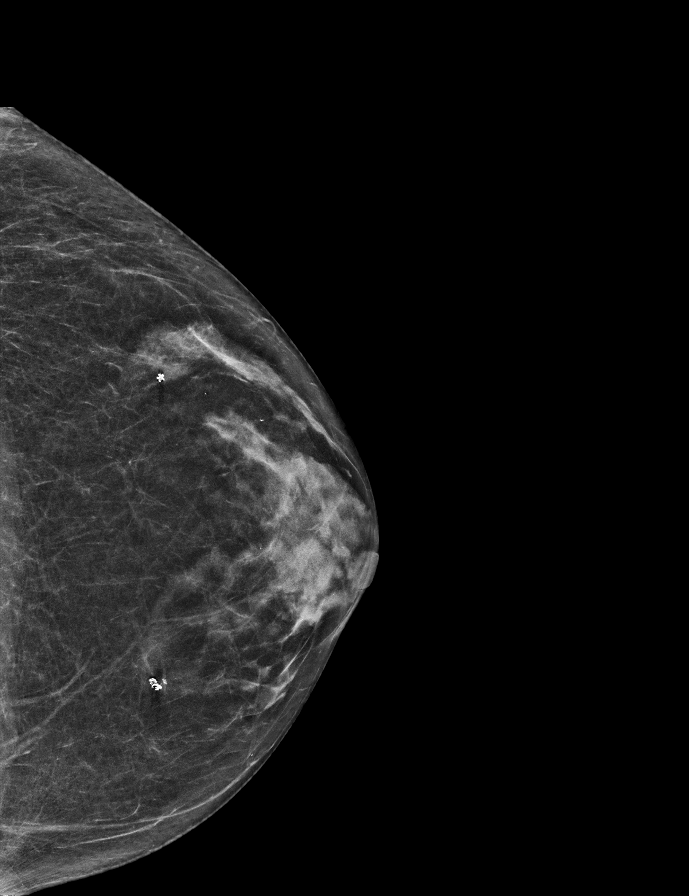

[L MLO synth-2D]
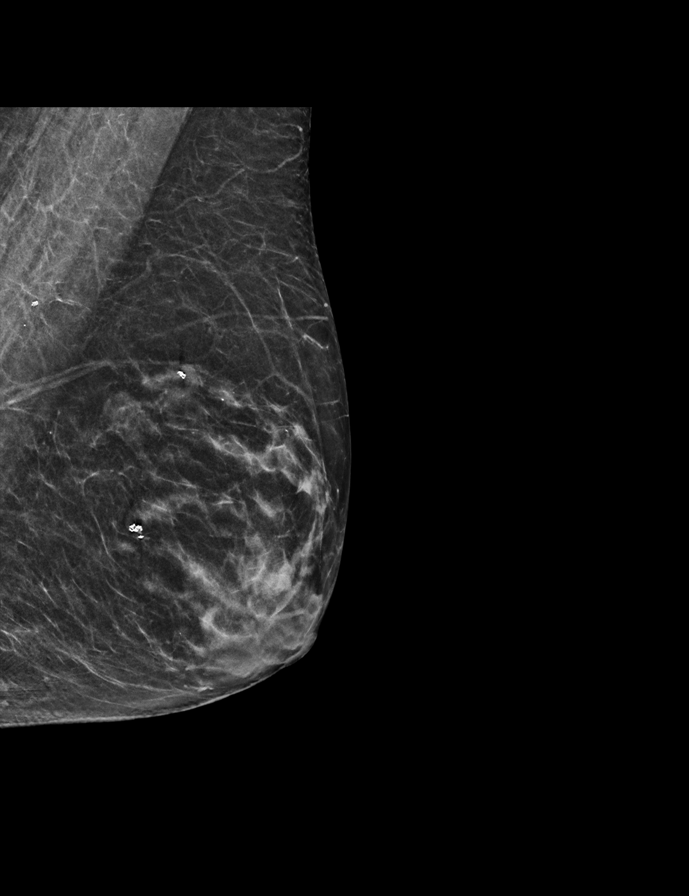

[L MLO tomo · 2 of 53 frames shown]
[frame 18/53]
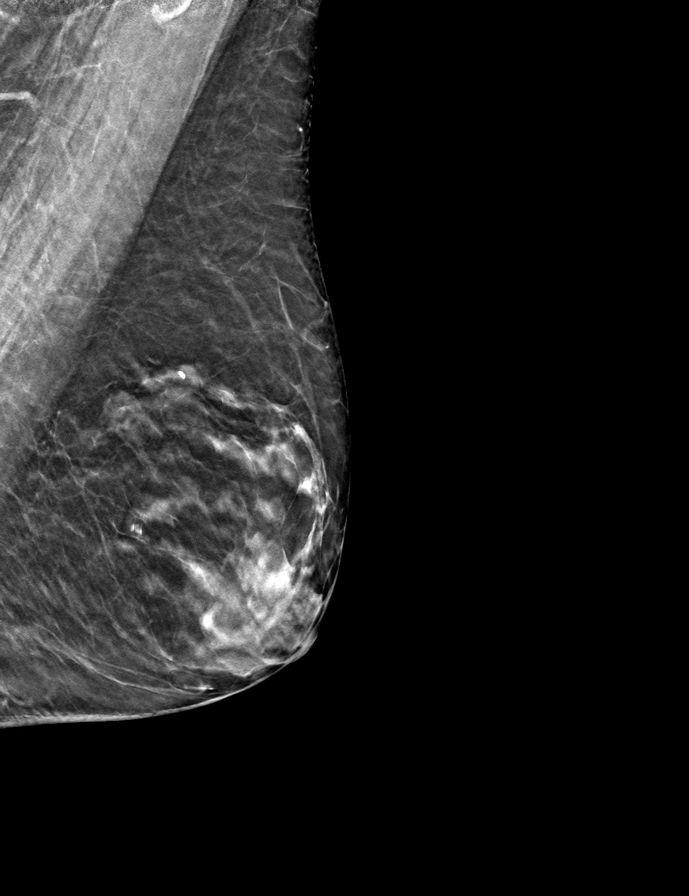
[frame 27/53]
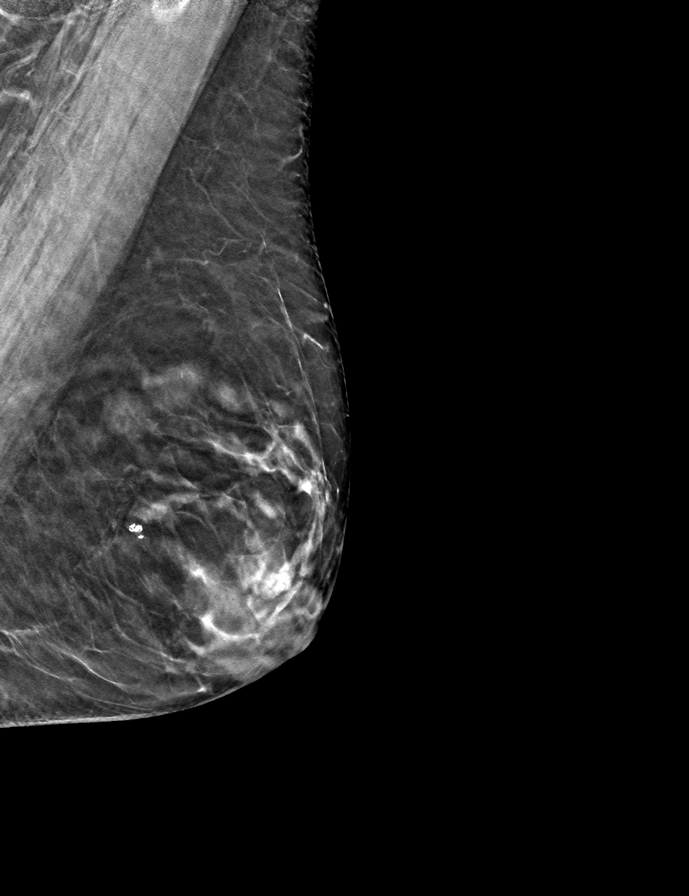

[R MLO tomo · tomo slice 27/54.0]
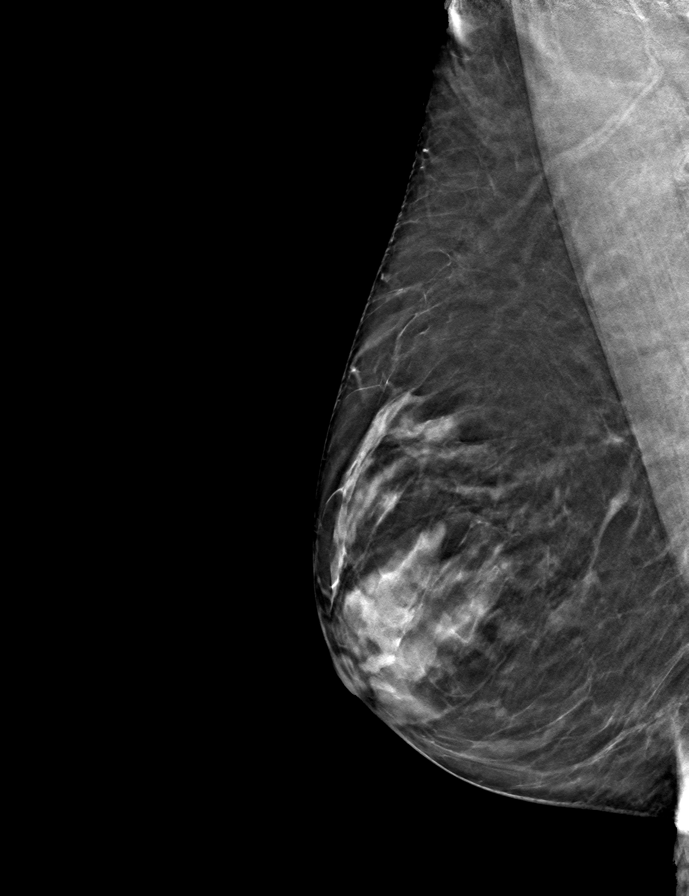

[L CC tomo · tomo slice 25/50.0]
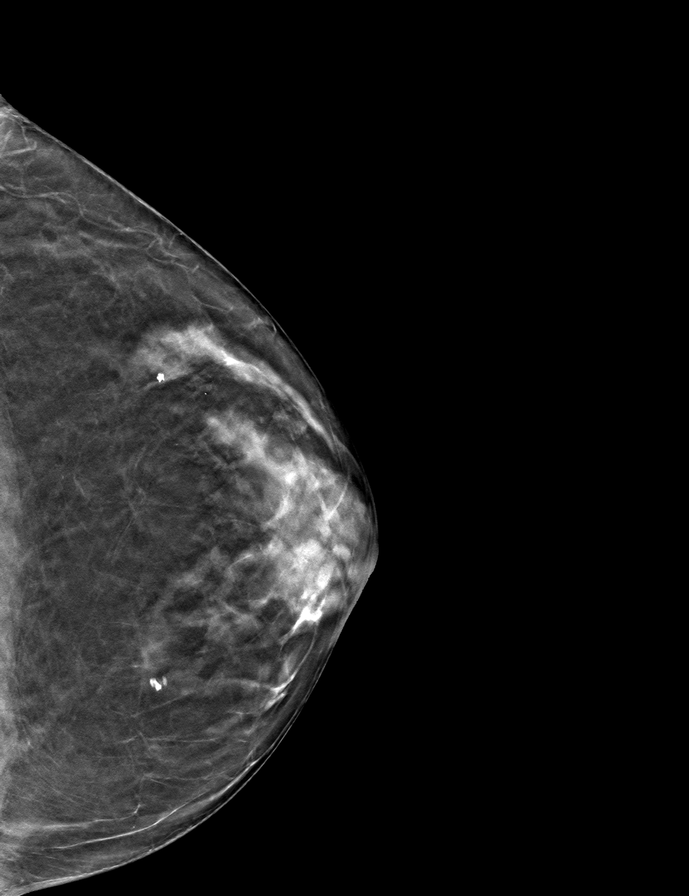

[R CC tomo · tomo slice 27/52.0]
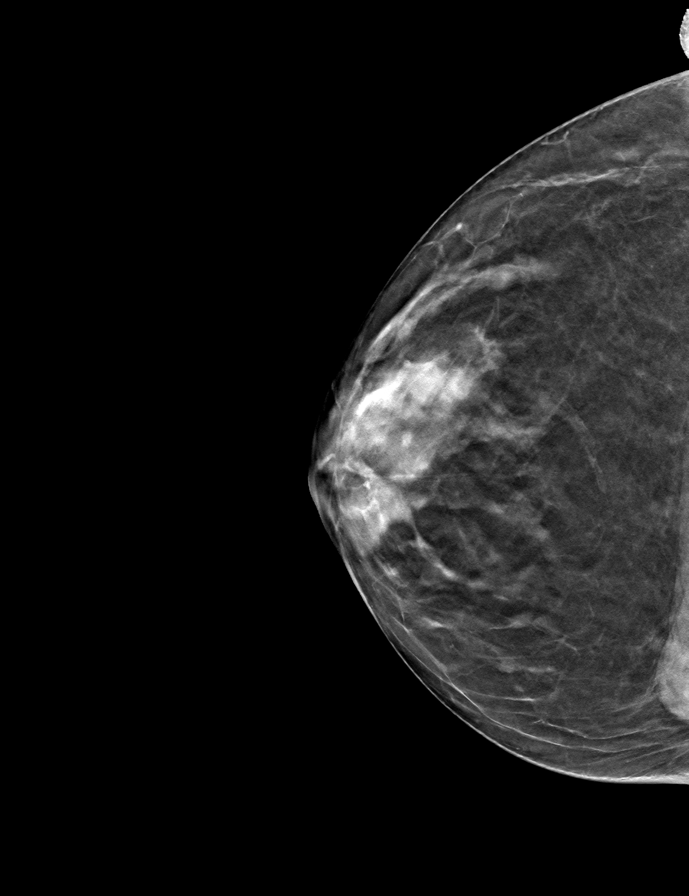

[9 of 24 positions shown; findings below may reference images not displayed]

ACR Breast Density Category b: There are scattered areas of
fibroglandular density.
FINDINGS: There are no findings suspicious for malignancy.
IMPRESSION: No mammographic evidence of malignancy. A result letter of this
screening mammogram will be mailed directly to the patient.

RECOMMENDATION:
Screening mammogram in one year. (Code:51-O-LD2)

BI-RADS CATEGORY  1: Negative.

## 2022-07-31 ENCOUNTER — Ambulatory Visit: Admission: RE | Admit: 2022-07-31 | Payer: Medicare PPO | Source: Ambulatory Visit

## 2022-07-31 ENCOUNTER — Ambulatory Visit
Admission: RE | Admit: 2022-07-31 | Discharge: 2022-07-31 | Disposition: A | Discharge status: Home / Self Care | Payer: Medicare PPO | Source: Ambulatory Visit | Attending: Obstetrics and Gynecology | Admitting: Obstetrics and Gynecology

## 2022-07-31 DIAGNOSIS — Z1231 Encounter for screening mammogram for malignant neoplasm of breast: Secondary | ICD-10-CM

## 2022-09-18 ENCOUNTER — Other Ambulatory Visit: Payer: Self-pay | Admitting: Obstetrics and Gynecology

## 2022-09-18 DIAGNOSIS — Z8619 Personal history of other infectious and parasitic diseases: Secondary | ICD-10-CM

## 2022-09-28 DIAGNOSIS — H04123 Dry eye syndrome of bilateral lacrimal glands: Secondary | ICD-10-CM | POA: Diagnosis not present

## 2022-09-28 DIAGNOSIS — H16141 Punctate keratitis, right eye: Secondary | ICD-10-CM | POA: Diagnosis not present

## 2022-10-04 DIAGNOSIS — H5203 Hypermetropia, bilateral: Secondary | ICD-10-CM | POA: Diagnosis not present

## 2022-10-04 DIAGNOSIS — H524 Presbyopia: Secondary | ICD-10-CM | POA: Diagnosis not present

## 2022-10-04 DIAGNOSIS — H53143 Visual discomfort, bilateral: Secondary | ICD-10-CM | POA: Diagnosis not present

## 2022-10-04 DIAGNOSIS — H353131 Nonexudative age-related macular degeneration, bilateral, early dry stage: Secondary | ICD-10-CM | POA: Diagnosis not present

## 2022-10-04 DIAGNOSIS — H52223 Regular astigmatism, bilateral: Secondary | ICD-10-CM | POA: Diagnosis not present

## 2022-10-04 DIAGNOSIS — H25013 Cortical age-related cataract, bilateral: Secondary | ICD-10-CM | POA: Diagnosis not present

## 2022-10-04 DIAGNOSIS — H25813 Combined forms of age-related cataract, bilateral: Secondary | ICD-10-CM | POA: Diagnosis not present

## 2022-10-20 DIAGNOSIS — E785 Hyperlipidemia, unspecified: Secondary | ICD-10-CM | POA: Diagnosis not present

## 2022-11-13 ENCOUNTER — Other Ambulatory Visit: Payer: Self-pay | Admitting: Obstetrics and Gynecology

## 2022-11-13 DIAGNOSIS — Z8619 Personal history of other infectious and parasitic diseases: Secondary | ICD-10-CM

## 2022-11-14 NOTE — Telephone Encounter (Signed)
B&P 02/22/22.

## 2022-11-14 NOTE — Telephone Encounter (Signed)
Please call the patient and confirm that she has labs with her primary. She is a kidney donor and I want to make sure her renal function is normal. As long as she has no h/o kidney dysfunction she can have a 30 valtrex with one refill.

## 2022-11-20 NOTE — Telephone Encounter (Signed)
I spoke with patient. She said that she has renal function checked twice a year and last was four months ago and all is well.  Rx sent.

## 2023-03-15 DIAGNOSIS — E785 Hyperlipidemia, unspecified: Secondary | ICD-10-CM | POA: Diagnosis not present

## 2023-05-14 DIAGNOSIS — H00012 Hordeolum externum right lower eyelid: Secondary | ICD-10-CM | POA: Diagnosis not present

## 2023-05-30 DIAGNOSIS — D2271 Melanocytic nevi of right lower limb, including hip: Secondary | ICD-10-CM | POA: Diagnosis not present

## 2023-05-30 DIAGNOSIS — D2239 Melanocytic nevi of other parts of face: Secondary | ICD-10-CM | POA: Diagnosis not present

## 2023-05-30 DIAGNOSIS — L820 Inflamed seborrheic keratosis: Secondary | ICD-10-CM | POA: Diagnosis not present

## 2023-05-30 DIAGNOSIS — L814 Other melanin hyperpigmentation: Secondary | ICD-10-CM | POA: Diagnosis not present

## 2023-05-30 DIAGNOSIS — L821 Other seborrheic keratosis: Secondary | ICD-10-CM | POA: Diagnosis not present

## 2023-08-15 ENCOUNTER — Other Ambulatory Visit: Payer: Self-pay

## 2023-08-15 DIAGNOSIS — Z8619 Personal history of other infectious and parasitic diseases: Secondary | ICD-10-CM

## 2023-08-15 NOTE — Telephone Encounter (Signed)
Medication refill request: Valtrex  Last AEX:  02/02/22 with JJ  Next AEX: not scheduled  Last MMG (if hormonal medication request): N/A Refill authorized: please advise

## 2023-08-16 MED ORDER — VALACYCLOVIR HCL 500 MG PO TABS: ORAL_TABLET | ORAL | 1 refills | Status: AC

## 2023-08-31 DIAGNOSIS — E785 Hyperlipidemia, unspecified: Secondary | ICD-10-CM | POA: Diagnosis not present

## 2023-08-31 DIAGNOSIS — Z1212 Encounter for screening for malignant neoplasm of rectum: Secondary | ICD-10-CM | POA: Diagnosis not present

## 2023-08-31 DIAGNOSIS — D72819 Decreased white blood cell count, unspecified: Secondary | ICD-10-CM | POA: Diagnosis not present

## 2023-08-31 DIAGNOSIS — E559 Vitamin D deficiency, unspecified: Secondary | ICD-10-CM | POA: Diagnosis not present

## 2023-09-03 DIAGNOSIS — Z1331 Encounter for screening for depression: Secondary | ICD-10-CM | POA: Diagnosis not present

## 2023-09-03 DIAGNOSIS — M85851 Other specified disorders of bone density and structure, right thigh: Secondary | ICD-10-CM | POA: Diagnosis not present

## 2023-09-03 DIAGNOSIS — D72819 Decreased white blood cell count, unspecified: Secondary | ICD-10-CM | POA: Diagnosis not present

## 2023-09-03 DIAGNOSIS — Z1339 Encounter for screening examination for other mental health and behavioral disorders: Secondary | ICD-10-CM | POA: Diagnosis not present

## 2023-09-03 DIAGNOSIS — F4329 Adjustment disorder with other symptoms: Secondary | ICD-10-CM | POA: Diagnosis not present

## 2023-09-03 DIAGNOSIS — R82998 Other abnormal findings in urine: Secondary | ICD-10-CM | POA: Diagnosis not present

## 2023-09-03 DIAGNOSIS — Z0000 Encounter for general adult medical examination without abnormal findings: Secondary | ICD-10-CM | POA: Diagnosis not present

## 2023-09-03 DIAGNOSIS — Z524 Kidney donor: Secondary | ICD-10-CM | POA: Diagnosis not present

## 2023-09-03 DIAGNOSIS — F419 Anxiety disorder, unspecified: Secondary | ICD-10-CM | POA: Diagnosis not present

## 2023-09-03 DIAGNOSIS — Z1212 Encounter for screening for malignant neoplasm of rectum: Secondary | ICD-10-CM | POA: Diagnosis not present

## 2023-09-03 DIAGNOSIS — Z905 Acquired absence of kidney: Secondary | ICD-10-CM | POA: Diagnosis not present

## 2023-09-03 DIAGNOSIS — M85852 Other specified disorders of bone density and structure, left thigh: Secondary | ICD-10-CM | POA: Diagnosis not present

## 2023-09-03 DIAGNOSIS — E785 Hyperlipidemia, unspecified: Secondary | ICD-10-CM | POA: Diagnosis not present

## 2023-09-11 ENCOUNTER — Other Ambulatory Visit: Payer: Self-pay | Admitting: Internal Medicine

## 2023-09-11 DIAGNOSIS — Z1231 Encounter for screening mammogram for malignant neoplasm of breast: Secondary | ICD-10-CM

## 2023-09-26 ENCOUNTER — Ambulatory Visit
Admission: RE | Admit: 2023-09-26 | Discharge: 2023-09-26 | Disposition: A | Discharge status: Home / Self Care | Payer: Medicare PPO | Source: Ambulatory Visit | Attending: Internal Medicine | Admitting: Internal Medicine

## 2023-09-26 DIAGNOSIS — Z1231 Encounter for screening mammogram for malignant neoplasm of breast: Secondary | ICD-10-CM

## 2024-01-28 DIAGNOSIS — H2513 Age-related nuclear cataract, bilateral: Secondary | ICD-10-CM | POA: Diagnosis not present

## 2024-01-28 DIAGNOSIS — H353132 Nonexudative age-related macular degeneration, bilateral, intermediate dry stage: Secondary | ICD-10-CM | POA: Diagnosis not present

## 2024-01-28 DIAGNOSIS — H52203 Unspecified astigmatism, bilateral: Secondary | ICD-10-CM | POA: Diagnosis not present

## 2024-05-28 DIAGNOSIS — H0012 Chalazion right lower eyelid: Secondary | ICD-10-CM | POA: Diagnosis not present

## 2024-06-16 DIAGNOSIS — R21 Rash and other nonspecific skin eruption: Secondary | ICD-10-CM | POA: Diagnosis not present

## 2024-06-16 DIAGNOSIS — L814 Other melanin hyperpigmentation: Secondary | ICD-10-CM | POA: Diagnosis not present

## 2024-06-16 DIAGNOSIS — L821 Other seborrheic keratosis: Secondary | ICD-10-CM | POA: Diagnosis not present

## 2024-06-16 DIAGNOSIS — D2271 Melanocytic nevi of right lower limb, including hip: Secondary | ICD-10-CM | POA: Diagnosis not present

## 2024-07-02 DIAGNOSIS — S20222A Contusion of left back wall of thorax, initial encounter: Secondary | ICD-10-CM | POA: Diagnosis not present

## 2024-07-16 DIAGNOSIS — D0472 Carcinoma in situ of skin of left lower limb, including hip: Secondary | ICD-10-CM | POA: Diagnosis not present

## 2024-07-16 DIAGNOSIS — D485 Neoplasm of uncertain behavior of skin: Secondary | ICD-10-CM | POA: Diagnosis not present

## 2024-07-16 DIAGNOSIS — R21 Rash and other nonspecific skin eruption: Secondary | ICD-10-CM | POA: Diagnosis not present

## 2024-07-31 DIAGNOSIS — D0472 Carcinoma in situ of skin of left lower limb, including hip: Secondary | ICD-10-CM | POA: Diagnosis not present

## 2024-08-29 ENCOUNTER — Other Ambulatory Visit: Payer: Self-pay | Admitting: Nurse Practitioner

## 2024-08-29 DIAGNOSIS — Z8619 Personal history of other infectious and parasitic diseases: Secondary | ICD-10-CM

## 2024-09-01 NOTE — Telephone Encounter (Signed)
 Spoke with patient, advised per Dr. Nikki. Patient verbalizes understanding. Declines OV at [facility] at this time. Patient states she is scheduled for lab work this week with PCP, she will reach out to him. Patient appreciative of call.   Rx refused.   Encounter closed.

## 2024-09-01 NOTE — Telephone Encounter (Signed)
 Med refill request:valacyclovir  500 mg tab, TAKE ONE TABLET BY MOUTH TWICE DAILY FOR 3 DAYS AS NEEDED   Hx HSV2  Last AEX: 02/02/22 -JJ Next AEX: Not scheduled  Last MMG (if hormonal med) N/A  Spoke with patient. Patient reports current HSV outbreak, does not have enough Rx left to complete 3 days. Patient states she was not planning to schedule GYN f/u at her age. Advised if she plans to have GYN continue to provide medication, OV is needed yearly. Discussed also reviewing with PCP to manage Rx. Patient is scheduled to see PCP on 09/12/24, will further discuss. Requesting refill until then. Advised I will review with provider in office and f/u. Patient appreciative of call.   Routing to Dr. Nikki to review.  Refill authorized: Please Advise?

## 2024-09-12 ENCOUNTER — Other Ambulatory Visit: Payer: Self-pay | Admitting: Internal Medicine

## 2024-09-12 DIAGNOSIS — Z1231 Encounter for screening mammogram for malignant neoplasm of breast: Secondary | ICD-10-CM

## 2024-09-30 ENCOUNTER — Ambulatory Visit

## 2024-10-03 ENCOUNTER — Ambulatory Visit: Admission: RE | Admit: 2024-10-03 | Source: Ambulatory Visit

## 2024-10-03 DIAGNOSIS — Z1231 Encounter for screening mammogram for malignant neoplasm of breast: Secondary | ICD-10-CM
